# Patient Record
Sex: Female | Born: 1945 | Race: White | Hispanic: No | Marital: Married | Smoking: Former smoker
Health system: Southern US, Community
[De-identification: ages and names within clinical notes are randomized; demographics above are authoritative.]

## PROBLEM LIST (undated history)

## (undated) DIAGNOSIS — M109 Gout, unspecified: Secondary | ICD-10-CM

## (undated) DIAGNOSIS — M1990 Unspecified osteoarthritis, unspecified site: Secondary | ICD-10-CM

## (undated) DIAGNOSIS — T7840XA Allergy, unspecified, initial encounter: Secondary | ICD-10-CM

## (undated) DIAGNOSIS — K219 Gastro-esophageal reflux disease without esophagitis: Secondary | ICD-10-CM

## (undated) DIAGNOSIS — Z8619 Personal history of other infectious and parasitic diseases: Secondary | ICD-10-CM

## (undated) DIAGNOSIS — K8020 Calculus of gallbladder without cholecystitis without obstruction: Secondary | ICD-10-CM

## (undated) DIAGNOSIS — E079 Disorder of thyroid, unspecified: Secondary | ICD-10-CM

## (undated) DIAGNOSIS — K635 Polyp of colon: Secondary | ICD-10-CM

## (undated) DIAGNOSIS — I10 Essential (primary) hypertension: Secondary | ICD-10-CM

## (undated) HISTORY — DX: Personal history of other infectious and parasitic diseases: Z86.19

## (undated) HISTORY — DX: Gout, unspecified: M10.9

## (undated) HISTORY — DX: Calculus of gallbladder without cholecystitis without obstruction: K80.20

## (undated) HISTORY — DX: Disorder of thyroid, unspecified: E07.9

## (undated) HISTORY — PX: KNEE SURGERY: SHX244

## (undated) HISTORY — PX: HERNIA REPAIR: SHX51

## (undated) HISTORY — DX: Essential (primary) hypertension: I10

## (undated) HISTORY — DX: Unspecified osteoarthritis, unspecified site: M19.90

## (undated) HISTORY — DX: Gastro-esophageal reflux disease without esophagitis: K21.9

## (undated) HISTORY — DX: Allergy, unspecified, initial encounter: T78.40XA

## (undated) HISTORY — PX: WISDOM TOOTH EXTRACTION: SHX21

## (undated) HISTORY — PX: COLONOSCOPY: SHX174

## (undated) HISTORY — DX: Polyp of colon: K63.5

---

## 2001-02-10 HISTORY — PX: CHOLECYSTECTOMY: SHX55

## 2005-02-10 HISTORY — PX: ABDOMINAL HYSTERECTOMY: SHX81

## 2016-10-23 DIAGNOSIS — H524 Presbyopia: Secondary | ICD-10-CM | POA: Diagnosis not present

## 2016-10-23 DIAGNOSIS — H52223 Regular astigmatism, bilateral: Secondary | ICD-10-CM | POA: Diagnosis not present

## 2016-10-23 DIAGNOSIS — H5203 Hypermetropia, bilateral: Secondary | ICD-10-CM | POA: Diagnosis not present

## 2016-10-23 DIAGNOSIS — H259 Unspecified age-related cataract: Secondary | ICD-10-CM | POA: Diagnosis not present

## 2017-01-12 ENCOUNTER — Other Ambulatory Visit (INDEPENDENT_AMBULATORY_CARE_PROVIDER_SITE_OTHER): Payer: Medicare Other

## 2017-01-12 ENCOUNTER — Ambulatory Visit (INDEPENDENT_AMBULATORY_CARE_PROVIDER_SITE_OTHER): Payer: Medicare Other | Admitting: Family Medicine

## 2017-01-12 ENCOUNTER — Encounter: Payer: Self-pay | Admitting: Family Medicine

## 2017-01-12 VITALS — BP 134/84 | HR 61 | Temp 98.1°F | Ht 63.75 in | Wt 233.4 lb

## 2017-01-12 DIAGNOSIS — M1A9XX1 Chronic gout, unspecified, with tophus (tophi): Secondary | ICD-10-CM

## 2017-01-12 DIAGNOSIS — E039 Hypothyroidism, unspecified: Secondary | ICD-10-CM

## 2017-01-12 DIAGNOSIS — I10 Essential (primary) hypertension: Secondary | ICD-10-CM

## 2017-01-12 DIAGNOSIS — K219 Gastro-esophageal reflux disease without esophagitis: Secondary | ICD-10-CM

## 2017-01-12 DIAGNOSIS — Z13220 Encounter for screening for lipoid disorders: Secondary | ICD-10-CM

## 2017-01-12 DIAGNOSIS — M109 Gout, unspecified: Secondary | ICD-10-CM | POA: Insufficient documentation

## 2017-01-12 HISTORY — DX: Essential (primary) hypertension: I10

## 2017-01-12 HISTORY — DX: Hypothyroidism, unspecified: E03.9

## 2017-01-12 LAB — TSH: TSH: 4.41 u[IU]/mL (ref 0.35–4.50)

## 2017-01-12 LAB — COMPREHENSIVE METABOLIC PANEL
ALK PHOS: 61 U/L (ref 39–117)
ALT: 12 U/L (ref 0–35)
AST: 15 U/L (ref 0–37)
Albumin: 4.6 g/dL (ref 3.5–5.2)
BILIRUBIN TOTAL: 0.5 mg/dL (ref 0.2–1.2)
BUN: 18 mg/dL (ref 6–23)
CALCIUM: 10.2 mg/dL (ref 8.4–10.5)
CO2: 30 meq/L (ref 19–32)
Chloride: 104 mEq/L (ref 96–112)
Creatinine, Ser: 0.82 mg/dL (ref 0.40–1.20)
GFR: 73.37 mL/min (ref >60.00)
Glucose, Bld: 98 mg/dL (ref 70–99)
Potassium: 4.6 mEq/L (ref 3.5–5.1)
Sodium: 142 mEq/L (ref 135–145)
TOTAL PROTEIN: 7.1 g/dL (ref 6.0–8.3)

## 2017-01-12 LAB — CBC
HCT: 43.6 % (ref 36.0–46.0)
Hemoglobin: 14.4 g/dL (ref 12.0–15.0)
MCHC: 32.9 g/dL (ref 30.0–36.0)
MCV: 89.4 fl (ref 78.0–100.0)
PLATELETS: 264 10*3/uL (ref 150.0–400.0)
RBC: 4.88 Mil/uL (ref 3.87–5.11)
RDW: 14.7 % (ref 11.5–15.5)
WBC: 6.6 10*3/uL (ref 4.0–10.5)

## 2017-01-12 LAB — T4, FREE: Free T4: 1.02 ng/dL (ref 0.60–1.60)

## 2017-01-12 LAB — LIPID PANEL
CHOL/HDL RATIO: 3
Cholesterol: 202 mg/dL — ABNORMAL HIGH (ref 0–200)
HDL: 70.5 mg/dL (ref >39.00)
LDL Cholesterol: 108 mg/dL — ABNORMAL HIGH (ref 0–99)
NonHDL: 131.69
TRIGLYCERIDES: 118 mg/dL (ref 0.0–149.0)
VLDL: 23.6 mg/dL (ref 0.0–40.0)

## 2017-01-12 MED ORDER — LEVOTHYROXINE SODIUM 50 MCG PO TABS: 50.0000 ug | ORAL_TABLET | Freq: Every day | ORAL | 3 refills | Status: DC

## 2017-01-12 NOTE — Addendum Note (Signed)
Addended by: Harl Bowie on: 01/12/2017 12:14 PM   Modules accepted: Orders

## 2017-01-12 NOTE — Assessment & Plan Note (Signed)
Continue protonix.  Was unable to wean off of PPI.

## 2017-01-12 NOTE — Assessment & Plan Note (Signed)
Continue allopurinol 

## 2017-01-12 NOTE — Addendum Note (Signed)
Addended by: Lynnea Ferrier on: 01/12/2017 11:46 AM   Modules accepted: Orders

## 2017-01-12 NOTE — Assessment & Plan Note (Signed)
Well controlled on current rxs. No changes made. 

## 2017-01-12 NOTE — Addendum Note (Signed)
Addended by: Harl Bowie on: 01/12/2017 12:13 PM   Modules accepted: Orders

## 2017-01-12 NOTE — Addendum Note (Signed)
Addended by: Lynnea Ferrier on: 01/12/2017 11:59 AM   Modules accepted: Orders

## 2017-01-12 NOTE — Assessment & Plan Note (Signed)
Continue current dose of synthroid.  Check labs today. 

## 2017-01-12 NOTE — Patient Instructions (Signed)
Great to meet you.  We will call you with your lab results and you can view them online.

## 2017-01-12 NOTE — Progress Notes (Signed)
Subjective:   Patient ID: Virginia Parrish, female    DOB: [birth year], 71 y.o.   MRN: [id number]  Virginia Parrish is a pleasant 71 y.o. year old female who presents to clinic today with [place] (Patient is here today to establish care.  She comes to [place] from [place] in [place].  She is not currently fasting.  She had her last pap 4.2018 and states that when we receive her records we will get all of that including mammogram ect.)  on 01/12/2017  HPI:  Hypothyroidism- on synthroid 50 mcg daily and has been on this dose for over 5 years (maybe closer to 10). Denies any symptoms hypothyroidism or hyperthyroidism.  HTN- has been well controlled on current rx for years.  GERD- on protonix.  Was not able to tolerate symptoms off of it.  Gout- on allopurinol.  Has only had 3 flares but most recently- left big toe flare was the most severe.  Current Outpatient Medications on File Prior to Visit  Medication Sig Dispense Refill  . allopurinol (ZYLOPRIM) 100 MG tablet Take 100 mg by mouth daily.    . Calcium Carb-Cholecalciferol (LIQUID CALCIUM WITH D3 PO) Take 1 tablet by mouth daily.    . Cholecalciferol (VITAMIN D-3) 5000 units TABS Take 1 tablet by mouth daily.    . pantoprazole (PROTONIX) 40 MG tablet Take 40 mg by mouth daily.    Marland Kitchen spironolactone-hydrochlorothiazide (ALDACTAZIDE) 25-25 MG tablet Take 1 tablet by mouth daily.    . vitamin E 1000 UNIT capsule Take 1,000 Units by mouth daily.     No current facility-administered medications on file prior to visit.     Not on File  Past Medical History:  Diagnosis Date  . Allergy   . Colon polyp    benign  . GERD (gastroesophageal reflux disease)   . History of chicken pox   . Hypertension     Past Surgical History:  Procedure Laterality Date  . ABDOMINAL HYSTERECTOMY  2007   Complete due to cervical cancer  . CHOLECYSTECTOMY  2003    Family History  Problem Relation Age of Onset  . Arthritis Mother   .  Hearing loss Mother   . Hyperlipidemia Mother   . Hypertension Mother   . Kidney disease Mother   . Arthritis Father   . Heart disease Father   . Drug abuse Maternal Grandmother   . Arthritis Maternal Grandfather   . Drug abuse Paternal Grandfather     Social History   Socioeconomic History  . Marital status: Married    Spouse name: Not on file  . Number of children: Not on file  . Years of education: Not on file  . Highest education level: Not on file  Social Needs  . Financial resource strain: Not on file  . Food insecurity - worry: Not on file  . Food insecurity - inability: Not on file  . Transportation needs - medical: Not on file  . Transportation needs - non-medical: Not on file  Occupational History  . Not on file  Tobacco Use  . Smoking status: Never Smoker  . Smokeless tobacco: Never Used  Substance and Sexual Activity  . Alcohol use: Yes    Comment: Occas.  . Drug use: No  . Sexual activity: Yes    Partners: Male  Other Topics Concern  . Not on file  Social History Narrative  . Not on file     The PMH, PSH, Social History, Family History,  Medications, and allergies have been reviewed in [facility], and have been updated if relevant.   Review of Systems  Constitutional: Negative.   HENT: Negative.   Eyes: Negative.   Respiratory: Negative.   Cardiovascular: Negative.   Gastrointestinal: Negative.   Endocrine: Negative.   Genitourinary: Negative.   Musculoskeletal: Negative.   Allergic/Immunologic: Negative.   Neurological: Negative.   Hematological: Negative.   Psychiatric/Behavioral: Negative.   All other systems reviewed and are negative.      Objective:    BP 134/84 (BP Location: Left Arm, Patient Position: Sitting, Cuff Size: Large)   Pulse 61   Temp 98.1 F (36.7 C) (Oral)   Ht 5' 3.75" (1.619 m)   Wt 233 lb 6.4 oz (105.9 kg)   SpO2 98%   BMI 40.38 kg/m    Physical Exam   General:  Well-developed,well-nourished,in no acute  distress; alert,appropriate and cooperative throughout examination Head:  normocephalic and atraumatic.   Eyes:  vision grossly intact, PERRL Ears:  R ear normal and L ear normal externally, TMs clear bilaterally Nose:  no external deformity.   Mouth:  good dentition.   Neck:  No deformities, masses, or tenderness noted. Lungs:  Normal respiratory effort, chest expands symmetrically. Lungs are clear to auscultation, no crackles or wheezes. Heart:  Normal rate and regular rhythm. S1 and S2 normal without gallop, murmur, click, rub or other extra sounds. Abdomen:  Bowel sounds positive,abdomen soft and non-tender without masses, organomegaly or hernias noted. Msk:  No deformity or scoliosis noted of thoracic or lumbar spine.   Extremities:  No clubbing, cyanosis, edema, or deformity noted with normal full range of motion of all joints.   Neurologic:  alert & oriented X3 and gait normal.   Skin:  Intact without suspicious lesions or rashes Cervical Nodes:  No lymphadenopathy noted Axillary Nodes:  No palpable lymphadenopathy Psych:  Cognition and judgment appear intact. Alert and cooperative with normal attention span and concentration. 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

## 2017-01-14 ENCOUNTER — Telehealth: Payer: Self-pay | Admitting: Family Medicine

## 2017-01-14 NOTE — Telephone Encounter (Signed)
Copied from [place] [id number]. Topic: Quick Communication - See Telephone Encounter >> Jan 14, 2017  1:15 PM Aurelio Brash B wrote: CRM for notification. See Telephone encounter for:  Pt got refill on thyroid med,  usually gwets synthroid  this time it was levothyroxine, pt wants to make sure Dr Deborra Medina is ok with that,  pt is ok with it

## 2017-01-15 NOTE — Telephone Encounter (Signed)
Sometimes people do have different reactions to the generic versus trade name but most often, they are interchangable.  We can either just keep an eye her symptoms or call in trade name.

## 2017-01-15 NOTE — Telephone Encounter (Signed)
TA-Plz see note stating that pt received generic Levothyroxine when she usually gets DAW but she says she is ok with this/plz advise if this is ok with you as well/thx dmf

## 2017-01-16 ENCOUNTER — Encounter: Payer: Self-pay | Admitting: Family Medicine

## 2017-01-16 NOTE — Telephone Encounter (Signed)
LMOVM stating that it is ok for generic but to watch for any symptoms/thx dmf

## 2017-02-25 DIAGNOSIS — H2513 Age-related nuclear cataract, bilateral: Secondary | ICD-10-CM | POA: Diagnosis not present

## 2017-02-25 DIAGNOSIS — H25013 Cortical age-related cataract, bilateral: Secondary | ICD-10-CM | POA: Diagnosis not present

## 2017-02-25 DIAGNOSIS — H524 Presbyopia: Secondary | ICD-10-CM | POA: Diagnosis not present

## 2017-02-25 DIAGNOSIS — H0014 Chalazion left upper eyelid: Secondary | ICD-10-CM | POA: Diagnosis not present

## 2017-03-09 ENCOUNTER — Telehealth: Payer: Self-pay | Admitting: Family Medicine

## 2017-03-09 MED ORDER — PANTOPRAZOLE SODIUM 40 MG PO TBEC: 40.0000 mg | DELAYED_RELEASE_TABLET | Freq: Every day | ORAL | 3 refills | Status: DC

## 2017-03-09 NOTE — Telephone Encounter (Signed)
Copied from [place] [id number]. Topic: Quick Communication - Rx Refill/Question >> Mar 09, 2017  1:00 PM Synthia Innocent wrote: Medication:   pantoprazole (PROTONIX) 40 MG tablet, 90 day supply  Has the patient contacted their pharmacy? Yes.     (Agent: If no, request that the patient contact the pharmacy for the refill.)  Preferred Pharmacy (with phone number or street name): [facility]   Agent: Please be advised that RX refills may take up to 3 business days. We ask that you follow-up with your pharmacy.

## 2017-03-09 NOTE — Telephone Encounter (Signed)
Rx approved/thx dmf

## 2017-03-09 NOTE — Telephone Encounter (Signed)
Pt requesting refill on Protonix.  90 day supply. LOV: 01/12/17 LR: no information. Just says "historical provider" Routed back to office.

## 2017-05-15 NOTE — Progress Notes (Signed)
Subjective:   Virginia Parrish is a 72 y.o. female who presents for an Initial Medicare Annual Wellness Visit. The Patient was informed that the wellness visit is to identify future health risk and educate and initiate measures that can reduce risk for increased disease through the lifespan.   Describes health as fair, good or great? Good  Review of Systems   No ROS.  Medicare Wellness Visit. Additional risk factors are reflected in the social history. Cardiac Risk Factors include: advanced age (>61men, >46 women);hypertension Sleep patterns: Strives for 8 hrs sleep each night. Melatonin. Smoke Alarms: Feels safe in home. Smoke alarms in place.  Living environment; residence and Firearm Safety: Lives with husband  In 1 story home.  Seat Belt Safety/Bike Helmet: Wears seat belt.   Female:   Pap- utd      Mammo- ordered       Dexa scan- utd       CCS- cologuard 08/13/2105-negative ( per pt)     Objective:    Today's Vitals   05/20/17 0938  BP: (!) 150/82  Pulse: (!) 52  SpO2: 98%  Weight: 239 lb 9.6 oz (108.7 kg)  Height: 5\' 4"  (1.626 m)   Body mass index is 41.13 kg/m.  Advanced Directives 05/20/2017  Does Patient Have a Medical Advance Directive? No  Would patient like information on creating a medical advance directive? Yes (MAU/Ambulatory/Procedural Areas - Information given)    Current Medications (verified) Outpatient Encounter Medications as of 05/20/2017  Medication Sig  . Calcium Carb-Cholecalciferol (LIQUID CALCIUM WITH D3 PO) Take 1 tablet by mouth daily.  . Cholecalciferol (VITAMIN D-3) 5000 units TABS Take 1 tablet by mouth daily.  . clotrimazole-betamethasone (LOTRISONE) cream Apply 1 application topically as needed.  Marland Kitchen levothyroxine (SYNTHROID) 50 MCG tablet Take 1 tablet (50 mcg total) by mouth daily before breakfast. (DAW)  . pantoprazole (PROTONIX) 40 MG tablet Take 1 tablet (40 mg total) by mouth daily.  Marland Kitchen spironolactone-hydrochlorothiazide (ALDACTAZIDE)  25-25 MG tablet Take 1 tablet by mouth daily.  . vitamin E 1000 UNIT capsule Take 1,000 Units by mouth daily.  Marland Kitchen allopurinol (ZYLOPRIM) 100 MG tablet Take 100 mg by mouth daily.   No facility-administered encounter medications on file as of 05/20/2017.     Allergies (verified) Patient has no known allergies.   History: Past Medical History:  Diagnosis Date  . Allergy   . Colon polyp    benign  . GERD (gastroesophageal reflux disease)   . History of chicken pox   . Hypertension    Past Surgical History:  Procedure Laterality Date  . ABDOMINAL HYSTERECTOMY  2007   Complete due to cervical cancer  . CHOLECYSTECTOMY  2003   Family History  Problem Relation Age of Onset  . Arthritis Mother   . Hearing loss Mother   . Hyperlipidemia Mother   . Hypertension Mother   . Kidney disease Mother   . Arthritis Father   . Heart disease Father   . Drug abuse Maternal Grandmother   . Arthritis Maternal Grandfather   . Drug abuse Paternal Grandfather    Social History   Socioeconomic History  . Marital status: Married    Spouse name: Not on file  . Number of children: Not on file  . Years of education: Not on file  . Highest education level: Not on file  Occupational History  . Not on file  Social Needs  . Financial resource strain: Not on file  . Food insecurity:  Worry: Not on file    Inability: Not on file  . Transportation needs:    Medical: Not on file    Non-medical: Not on file  Tobacco Use  . Smoking status: Never Smoker  . Smokeless tobacco: Never Used  Substance and Sexual Activity  . Alcohol use: Yes    Comment: wine with dinner  . Drug use: No  . Sexual activity: Yes    Partners: Male  Lifestyle  . Physical activity:    Days per week: Not on file    Minutes per session: Not on file  . Stress: Not on file  Relationships  . Social connections:    Talks on phone: Not on file    Gets together: Not on file    Attends religious service: Not on file     Active member of club or organization: Not on file    Attends meetings of clubs or organizations: Not on file    Relationship status: Not on file  Other Topics Concern  . Not on file  Social History Narrative  . Not on file    Tobacco Counseling Counseling given: Not Answered   Clinical Intake: Pain : No/denies pain   Activities of Daily Living In your present state of health, do you have any difficulty performing the following activities: 05/20/2017 01/12/2017  Hearing? N N  Vision? N N  Comment Pt states she has new prescription for glasses that she needs to get filled. last eye exam in january. -  Difficulty concentrating or making decisions? N N  Walking or climbing stairs? N N  Dressing or bathing? N N  Doing errands, shopping? N N  Preparing Food and eating ? N -  Using the Toilet? N -  In the past six months, have you accidently leaked urine? N -  Do you have problems with loss of bowel control? N -  Managing your Medications? N -  Managing your Finances? N -  Housekeeping or managing your Housekeeping? N -  Some recent data might be hidden     Immunizations and Health Maintenance  There is no immunization history on file for this patient. Health Maintenance Due  Topic Date Due  . Hepatitis C Screening  November 16, 1945  . TETANUS/TDAP  08/11/1966  . COLONOSCOPY  08/10/1997  . PNA vac Low Risk Adult (1 of 2 - PCV13) 08/10/2012    Patient Care Team: Lucille Passy, MD as PCP - General (Family Medicine)  Indicate any recent Medical Services you may have received from other than [facility] providers in the past year (date may be approximate).     Assessment:   This is a routine wellness examination for Virginia Parrish. Physical assessment deferred to PCP.  Hearing/Vision screen  Visual Acuity Screening   Right eye Left eye Both eyes  Without correction: 20/70 20/70 20/70   With correction:     Comments: Pt states she has a new prescription for glasses,but hasn't picked them  up yet.  Hearing Screening Comments: Able to hear conversational tones w/o difficulty. No issues reported.    Dietary issues and exercise activities discussed: Current Exercise Habits: Home exercise routine, Type of exercise: strength training/weights;treadmill, Time (Minutes): 60, Frequency (Times/Week): 2, Weekly Exercise (Minutes/Week): 120, Intensity: Mild   Diet (meal preparation, eat out, water intake, caffeinated beverages, dairy products, fruits and vegetables): in general, a "healthy" diet  , well balanced    Goals    . DIET - INCREASE WATER INTAKE    . Weight (  lb) < 200 lb (90.7 kg)      Depression Screen PHQ 2/9 Scores 05/20/2017 01/12/2017  PHQ - 2 Score 0 0    Fall Risk Fall Risk  05/20/2017 01/12/2017  Falls in the past year? No No    Cognitive Function: MMSE - Mini Mental State Exam 05/20/2017  Orientation to time 5  Orientation to Place 5  Registration 3  Attention/ Calculation 5  Recall 3  Language- name 2 objects 2  Language- repeat 1  Language- follow 3 step command 3  Language- read & follow direction 1  Write a sentence 1  Copy design 1  Total score 30        Screening Tests Health Maintenance  Topic Date Due  . Hepatitis C Screening  20-Dec-1945  . TETANUS/TDAP  08/11/1966  . COLONOSCOPY  08/10/1997  . PNA vac Low Risk Adult (1 of 2 - PCV13) 08/10/2012  . INFLUENZA VACCINE  09/10/2017  . MAMMOGRAM  11/01/2017  . DEXA SCAN  Completed      Plan:   Follow up with PCP as directed  Continue to eat heart healthy diet (full of fruits, vegetables, whole grains, lean protein, water--limit salt, fat, and sugar intake) and increase physical activity as tolerated.  Continue doing brain stimulating activities (puzzles, reading, adult coloring books, staying active) to keep memory sharp.   Bring a copy of your living will and/or healthcare power of attorney to your next office visit.  Consider Melatonin for sleep.  Please contact us with  information pertaining to your Pneumonia vaccines.   I have personally reviewed and noted the following in the patient's chart:   . Medical and social history . Use of alcohol, tobacco or illicit drugs  . Current medications and supplements . Functional ability and status . Nutritional status . Physical activity . Advanced directives . List of other physicians . Hospitalizations, surgeries, and ER visits in previous 12 months . Vitals . Screenings to include cognitive, depression, and falls . Referrals and appointments  In addition, I have reviewed and discussed with patient certain preventive protocols, quality metrics, and best practice recommendations. A written personalized care plan for preventive services as well as general preventive health recommendations were provided to patient.     Shela Nevin, [place]   05/20/2017

## 2017-05-20 ENCOUNTER — Ambulatory Visit (INDEPENDENT_AMBULATORY_CARE_PROVIDER_SITE_OTHER): Payer: Medicare Other | Admitting: Behavioral Health

## 2017-05-20 ENCOUNTER — Encounter: Payer: Self-pay | Admitting: Behavioral Health

## 2017-05-20 VITALS — BP 150/82 | HR 52 | Ht 64.0 in | Wt 239.6 lb

## 2017-05-20 DIAGNOSIS — Z0000 Encounter for general adult medical examination without abnormal findings: Secondary | ICD-10-CM | POA: Diagnosis not present

## 2017-05-20 DIAGNOSIS — Z1239 Encounter for other screening for malignant neoplasm of breast: Secondary | ICD-10-CM

## 2017-05-20 DIAGNOSIS — Z1231 Encounter for screening mammogram for malignant neoplasm of breast: Secondary | ICD-10-CM

## 2017-05-20 NOTE — Patient Instructions (Signed)
Continue to eat heart healthy diet (full of fruits, vegetables, whole grains, lean protein, water--limit salt, fat, and sugar intake) and increase physical activity as tolerated.  Continue doing brain stimulating activities (puzzles, reading, adult coloring books, staying active) to keep memory sharp.   Bring a copy of your living will and/or healthcare power of attorney to your next office visit.  Consider Melatonin for sleep.   Virginia Parrish , Thank you for taking time to come for your Medicare Wellness Visit. I appreciate your ongoing commitment to your health goals. Please review the following plan we discussed and let me know if I can assist you in the future.   These are the goals we discussed: Goals    . DIET - INCREASE WATER INTAKE    . Weight (lb) < 200 lb (90.7 kg)       This is a list of the screening recommended for you and due dates:  Health Maintenance  Topic Date Due  .  Hepatitis C: One time screening is recommended by Center for Disease Control  (CDC) for  adults born from [birth year] through 1963.   July 28, 1945  . Tetanus Vaccine  08/11/1966  . Colon Cancer Screening  08/10/1997  . Pneumonia vaccines (1 of 2 - PCV13) 08/10/2012  . Flu Shot  09/10/2017  . Mammogram  11/01/2017  . DEXA scan (bone density measurement)  Completed    Health Maintenance for Postmenopausal Women Menopause is a normal process in which your reproductive ability comes to an end. This process happens gradually over a span of months to years, usually between the ages of 61 and 85. Menopause is complete when you have missed 12 consecutive menstrual periods. It is important to talk with your health care provider about some of the most common conditions that affect postmenopausal women, such as heart disease, cancer, and bone loss (osteoporosis). Adopting a healthy lifestyle and getting preventive care can help to promote your health and wellness. Those actions can also lower your chances of developing some  of these common conditions. What should I know about menopause? During menopause, you may experience a number of symptoms, such as:  Moderate-to-severe hot flashes.  Night sweats.  Decrease in sex drive.  Mood swings.  Headaches.  Tiredness.  Irritability.  Memory problems.  Insomnia.  Choosing to treat or not to treat menopausal changes is an individual decision that you make with your health care provider. What should I know about hormone replacement therapy and supplements? Hormone therapy products are effective for treating symptoms that are associated with menopause, such as hot flashes and night sweats. Hormone replacement carries certain risks, especially as you become older. If you are thinking about using estrogen or estrogen with progestin treatments, discuss the benefits and risks with your health care provider. What should I know about heart disease and stroke? Heart disease, heart attack, and stroke become more likely as you age. This may be due, in part, to the hormonal changes that your body experiences during menopause. These can affect how your body processes dietary fats, triglycerides, and cholesterol. Heart attack and stroke are both medical emergencies. There are many things that you can do to help prevent heart disease and stroke:  Have your blood pressure checked at least every 1-2 years. High blood pressure causes heart disease and increases the risk of stroke.  If you are 79-68 years old, ask your health care provider if you should take aspirin to prevent a heart attack or a stroke.  Do  not use any tobacco products, including cigarettes, chewing tobacco, or electronic cigarettes. If you need help quitting, ask your health care provider.  It is important to eat a healthy diet and maintain a healthy weight. ? Be sure to include plenty of vegetables, fruits, low-fat dairy products, and lean protein. ? Avoid eating foods that are high in solid fats, added  sugars, or salt (sodium).  Get regular exercise. This is one of the most important things that you can do for your health. ? Try to exercise for at least 150 minutes each week. The type of exercise that you do should increase your heart rate and make you sweat. This is known as moderate-intensity exercise. ? Try to do strengthening exercises at least twice each week. Do these in addition to the moderate-intensity exercise.  Know your numbers.Ask your health care provider to check your cholesterol and your blood glucose. Continue to have your blood tested as directed by your health care provider.  What should I know about cancer screening? There are several types of cancer. Take the following steps to reduce your risk and to catch any cancer development as early as possible. Breast Cancer  Practice breast self-awareness. ? This means understanding how your breasts normally appear and feel. ? It also means doing regular breast self-exams. Let your health care provider know about any changes, no matter how small.  If you are 32 or older, have a clinician do a breast exam (clinical breast exam or CBE) every year. Depending on your age, family history, and medical history, it may be recommended that you also have a yearly breast X-ray (mammogram).  If you have a family history of breast cancer, talk with your health care provider about genetic screening.  If you are at high risk for breast cancer, talk with your health care provider about having an MRI and a mammogram every year.  Breast cancer (BRCA) gene test is recommended for women who have family members with BRCA-related cancers. Results of the assessment will determine the need for genetic counseling and BRCA1 and for BRCA2 testing. BRCA-related cancers include these types: ? Breast. This occurs in males or females. ? Ovarian. ? Tubal. This may also be called fallopian tube cancer. ? Cancer of the abdominal or pelvic lining (peritoneal  cancer). ? Prostate. ? Pancreatic.  Cervical, Uterine, and Ovarian Cancer Your health care provider may recommend that you be screened regularly for cancer of the pelvic organs. These include your ovaries, uterus, and vagina. This screening involves a pelvic exam, which includes checking for microscopic changes to the surface of your cervix (Pap test).  For women ages 21-65, health care providers may recommend a pelvic exam and a Pap test every three years. For women ages 37-65, they may recommend the Pap test and pelvic exam, combined with testing for human papilloma virus (HPV), every five years. Some types of HPV increase your risk of cervical cancer. Testing for HPV may also be done on women of any age who have unclear Pap test results.  Other health care providers may not recommend any screening for nonpregnant women who are considered low risk for pelvic cancer and have no symptoms. Ask your health care provider if a screening pelvic exam is right for you.  If you have had past treatment for cervical cancer or a condition that could lead to cancer, you need Pap tests and screening for cancer for at least 20 years after your treatment. If Pap tests have been discontinued for  you, your risk factors (such as having a new sexual partner) need to be reassessed to determine if you should start having screenings again. Some women have medical problems that increase the chance of getting cervical cancer. In these cases, your health care provider may recommend that you have screening and Pap tests more often.  If you have a family history of uterine cancer or ovarian cancer, talk with your health care provider about genetic screening.  If you have vaginal bleeding after reaching menopause, tell your health care provider.  There are currently no reliable tests available to screen for ovarian cancer.  Lung Cancer Lung cancer screening is recommended for adults 19-39 years old who are at high risk for  lung cancer because of a history of smoking. A yearly low-dose CT scan of the lungs is recommended if you:  Currently smoke.  Have a history of at least 30 pack-years of smoking and you currently smoke or have quit within the past 15 years. A pack-year is smoking an average of one pack of cigarettes per day for one year.  Yearly screening should:  Continue until it has been 15 years since you quit.  Stop if you develop a health problem that would prevent you from having lung cancer treatment.  Colorectal Cancer  This type of cancer can be detected and can often be prevented.  Routine colorectal cancer screening usually begins at age 41 and continues through age 17.  If you have risk factors for colon cancer, your health care provider may recommend that you be screened at an earlier age.  If you have a family history of colorectal cancer, talk with your health care provider about genetic screening.  Your health care provider may also recommend using home test kits to check for hidden blood in your stool.  A small camera at the end of a tube can be used to examine your colon directly (sigmoidoscopy or colonoscopy). This is done to check for the earliest forms of colorectal cancer.  Direct examination of the colon should be repeated every 5-10 years until age 5. However, if early forms of precancerous polyps or small growths are found or if you have a family history or genetic risk for colorectal cancer, you may need to be screened more often.  Skin Cancer  Check your skin from head to toe regularly.  Monitor any moles. Be sure to tell your health care provider: ? About any new moles or changes in moles, especially if there is a change in a mole's shape or color. ? If you have a mole that is larger than the size of a pencil eraser.  If any of your family members has a history of skin cancer, especially at a young age, talk with your health care provider about genetic  screening.  Always use sunscreen. Apply sunscreen liberally and repeatedly throughout the day.  Whenever you are outside, protect yourself by wearing long sleeves, pants, a wide-brimmed hat, and sunglasses.  What should I know about osteoporosis? Osteoporosis is a condition in which bone destruction happens more quickly than new bone creation. After menopause, you may be at an increased risk for osteoporosis. To help prevent osteoporosis or the bone fractures that can happen because of osteoporosis, the following is recommended:  If you are 74-40 years old, get at least 1,000 mg of calcium and at least 600 mg of vitamin D per day.  If you are older than age 81 but younger than age 1, get at  least 1,200 mg of calcium and at least 600 mg of vitamin D per day.  If you are older than age 22, get at least 1,200 mg of calcium and at least 800 mg of vitamin D per day.  Smoking and excessive alcohol intake increase the risk of osteoporosis. Eat foods that are rich in calcium and vitamin D, and do weight-bearing exercises several times each week as directed by your health care provider. What should I know about how menopause affects my mental health? Depression may occur at any age, but it is more common as you become older. Common symptoms of depression include:  Low or sad mood.  Changes in sleep patterns.  Changes in appetite or eating patterns.  Feeling an overall lack of motivation or enjoyment of activities that you previously enjoyed.  Frequent crying spells.  Talk with your health care provider if you think that you are experiencing depression. What should I know about immunizations? It is important that you get and maintain your immunizations. These include:  Tetanus, diphtheria, and pertussis (Tdap) booster vaccine.  Influenza every year before the flu season begins.  Pneumonia vaccine.  Shingles vaccine.  Your health care provider may also recommend other  immunizations. This information is not intended to replace advice given to you by your health care provider. Make sure you discuss any questions you have with your health care provider. Document Released: 03/21/2005 Document Revised: 08/17/2015 Document Reviewed: 10/31/2014 Elsevier Interactive Patient Education  2018 [employer].

## 2017-05-20 NOTE — Progress Notes (Signed)
I reviewed health advisor's note, was available for consultation, and agree with documentation and plan.  

## 2017-05-27 ENCOUNTER — Ambulatory Visit (HOSPITAL_BASED_OUTPATIENT_CLINIC_OR_DEPARTMENT_OTHER)
Admission: RE | Admit: 2017-05-27 | Discharge: 2017-05-27 | Disposition: A | Discharge status: Home / Self Care | Payer: Medicare Other | Source: Ambulatory Visit | Attending: Family Medicine | Admitting: Family Medicine

## 2017-05-27 DIAGNOSIS — Z1239 Encounter for other screening for malignant neoplasm of breast: Secondary | ICD-10-CM

## 2017-05-27 DIAGNOSIS — Z1231 Encounter for screening mammogram for malignant neoplasm of breast: Secondary | ICD-10-CM | POA: Insufficient documentation

## 2017-06-09 ENCOUNTER — Other Ambulatory Visit: Payer: Self-pay

## 2017-06-09 ENCOUNTER — Encounter: Payer: Self-pay | Admitting: Family Medicine

## 2017-06-09 DIAGNOSIS — E039 Hypothyroidism, unspecified: Secondary | ICD-10-CM

## 2017-06-09 NOTE — Progress Notes (Signed)
Per TA TSH, FT4, T3 for lab visit/orders placed/thx dmf

## 2017-06-18 ENCOUNTER — Other Ambulatory Visit (INDEPENDENT_AMBULATORY_CARE_PROVIDER_SITE_OTHER): Payer: Medicare Other

## 2017-06-18 DIAGNOSIS — E039 Hypothyroidism, unspecified: Secondary | ICD-10-CM

## 2017-06-18 LAB — TSH: TSH: 3.61 u[IU]/mL (ref 0.35–4.50)

## 2017-06-18 LAB — T4, FREE: Free T4: 0.95 ng/dL (ref 0.60–1.60)

## 2017-06-19 DIAGNOSIS — H25013 Cortical age-related cataract, bilateral: Secondary | ICD-10-CM | POA: Diagnosis not present

## 2017-06-19 DIAGNOSIS — H43813 Vitreous degeneration, bilateral: Secondary | ICD-10-CM | POA: Diagnosis not present

## 2017-06-19 DIAGNOSIS — H2513 Age-related nuclear cataract, bilateral: Secondary | ICD-10-CM | POA: Diagnosis not present

## 2017-06-19 LAB — T3: T3, Total: 123 ng/dL (ref 76–181)

## 2017-07-09 DIAGNOSIS — H25812 Combined forms of age-related cataract, left eye: Secondary | ICD-10-CM | POA: Diagnosis not present

## 2017-07-09 DIAGNOSIS — H2512 Age-related nuclear cataract, left eye: Secondary | ICD-10-CM | POA: Diagnosis not present

## 2017-07-09 DIAGNOSIS — H25012 Cortical age-related cataract, left eye: Secondary | ICD-10-CM | POA: Diagnosis not present

## 2017-07-23 DIAGNOSIS — H25811 Combined forms of age-related cataract, right eye: Secondary | ICD-10-CM | POA: Diagnosis not present

## 2017-07-23 DIAGNOSIS — H25011 Cortical age-related cataract, right eye: Secondary | ICD-10-CM | POA: Diagnosis not present

## 2017-07-23 DIAGNOSIS — H2511 Age-related nuclear cataract, right eye: Secondary | ICD-10-CM | POA: Diagnosis not present

## 2017-08-04 ENCOUNTER — Encounter: Payer: Self-pay | Admitting: Family Medicine

## 2017-08-04 NOTE — Telephone Encounter (Signed)
Yes okay to refill.  She has been on these rxs for years.

## 2017-08-04 NOTE — Telephone Encounter (Signed)
Dr. Deborra Medina  Please advise, we never send in Aldactazide 1 tab daily for the before and she is taking it for her BP from [place]. okey to send this in?   She will pick up rx from [facility] for synthroid.

## 2017-08-05 ENCOUNTER — Other Ambulatory Visit: Payer: Self-pay

## 2017-08-05 MED ORDER — SPIRONOLACTONE-HCTZ 25-25 MG PO TABS: 1.0000 | ORAL_TABLET | Freq: Every day | ORAL | 2 refills | Status: DC

## 2017-08-05 MED ORDER — LEVOTHYROXINE SODIUM 50 MCG PO TABS: 50.0000 ug | ORAL_TABLET | Freq: Every day | ORAL | 2 refills | Status: DC

## 2017-08-06 ENCOUNTER — Other Ambulatory Visit: Payer: Self-pay

## 2017-08-06 ENCOUNTER — Telehealth: Payer: Self-pay | Admitting: Family Medicine

## 2017-08-06 DIAGNOSIS — E039 Hypothyroidism, unspecified: Secondary | ICD-10-CM

## 2017-08-06 DIAGNOSIS — Z0000 Encounter for general adult medical examination without abnormal findings: Secondary | ICD-10-CM

## 2017-08-06 DIAGNOSIS — I10 Essential (primary) hypertension: Secondary | ICD-10-CM

## 2017-08-06 DIAGNOSIS — K219 Gastro-esophageal reflux disease without esophagitis: Secondary | ICD-10-CM

## 2017-08-06 NOTE — Telephone Encounter (Signed)
Copied from [place]. Topic: Quick Communication - See Telephone Encounter >> Aug 06, 2017  4:20 PM Burchel, Abbi R wrote:  See Telephone encounter for: 08/06/17.  [facility] [phone] Ref #: [id number]   (SYNTHROID) 50 MCG tablet is not covered by ins.Please re-send rx for levothyroxine  w/o brand name listed, if it is appropriate for the pt.

## 2017-08-08 NOTE — Telephone Encounter (Signed)
Generic Synthroid is not covered by insurance, will need the brand name Levothyroxine prescription sent, if appropriate for patient.  Last OV:01/12/17 KJI:ZXYO Pharmacy: [place], [place] [phone] (Phone) [phone] (Fax)

## 2017-08-10 MED ORDER — SYNTHROID 50 MCG PO TABS: 50.0000 ug | ORAL_TABLET | Freq: Every day | ORAL | 2 refills | Status: DC

## 2017-08-10 NOTE — Telephone Encounter (Signed)
Sent in per TA/thx dmf

## 2017-08-10 NOTE — Telephone Encounter (Signed)
Yes okay to send as requested. 

## 2017-08-10 NOTE — Telephone Encounter (Signed)
TA-May I send in Levothyroxine DAW as ins won't cover Generic Synthroid/plz advise/thx dmf

## 2017-08-17 ENCOUNTER — Telehealth: Payer: Self-pay | Admitting: Family Medicine

## 2017-08-17 NOTE — Telephone Encounter (Signed)
Copied from [place] [id number]. Topic: Quick Communication - Rx Refill/Question >> Aug 17, 2017  3:00 PM Gardiner Ramus wrote: Medication:SYNTHROID 50 MCG tablet [[id number]] [facility] and stated that insurance will not pay for brand name and would like L-thyroxine to be called in please advise refrence # [id number]  Preferred Pharmacy (with phone number or street name):[facility] [place], [place] - [address] [phone] (Phone) [phone] (Fax)  Agent: Please be advised that RX refills may take up to 3 business days. We ask that you follow-up with your pharmacy.

## 2017-08-18 NOTE — Telephone Encounter (Signed)
Medication:SYNTHROID 50 MCG tablet [[id number]]  'Maggie' from [facility],states that insurance will not pay for brand name and would like L-thyroxine to be called in. Please advise.  Reference  # [id number]  [place], [place] - [address]        [phone] (Phone) [phone] (Fax)

## 2017-08-18 NOTE — Telephone Encounter (Signed)
Insurance checking status. Call back [phone] REF # [id number]

## 2017-08-19 ENCOUNTER — Other Ambulatory Visit: Payer: Self-pay

## 2017-08-19 MED ORDER — LEVOTHYROXINE SODIUM 50 MCG PO TABS: 50.0000 ug | ORAL_TABLET | Freq: Every day | ORAL | 3 refills | Status: DC

## 2017-08-19 NOTE — Telephone Encounter (Signed)
Sent in generic per pt req/thx dmf

## 2017-10-16 DIAGNOSIS — Z23 Encounter for immunization: Secondary | ICD-10-CM | POA: Diagnosis not present

## 2017-11-20 ENCOUNTER — Ambulatory Visit (INDEPENDENT_AMBULATORY_CARE_PROVIDER_SITE_OTHER): Payer: Medicare Other

## 2017-11-20 ENCOUNTER — Encounter: Payer: Self-pay | Admitting: Nurse Practitioner

## 2017-11-20 ENCOUNTER — Ambulatory Visit (INDEPENDENT_AMBULATORY_CARE_PROVIDER_SITE_OTHER): Payer: Medicare Other | Admitting: Nurse Practitioner

## 2017-11-20 VITALS — BP 126/86 | HR 56 | Temp 98.2°F | Ht 64.0 in | Wt 231.0 lb

## 2017-11-20 DIAGNOSIS — J0100 Acute maxillary sinusitis, unspecified: Secondary | ICD-10-CM

## 2017-11-20 DIAGNOSIS — J209 Acute bronchitis, unspecified: Secondary | ICD-10-CM

## 2017-11-20 DIAGNOSIS — R05 Cough: Secondary | ICD-10-CM | POA: Diagnosis not present

## 2017-11-20 MED ORDER — FLUTICASONE PROPIONATE 50 MCG/ACT NA SUSP: 2.0000 | Freq: Every day | NASAL | 0 refills | Status: DC

## 2017-11-20 MED ORDER — ALBUTEROL SULFATE HFA 108 (90 BASE) MCG/ACT IN AERS: 1.0000 | INHALATION_SPRAY | Freq: Four times a day (QID) | RESPIRATORY_TRACT | 0 refills | Status: DC | PRN

## 2017-11-20 MED ORDER — GUAIFENESIN-DM 100-10 MG/5ML PO SYRP: 5.0000 mL | ORAL_SOLUTION | ORAL | 0 refills | Status: DC | PRN

## 2017-11-20 MED ORDER — AZITHROMYCIN 250 MG PO TABS: 250.0000 mg | ORAL_TABLET | Freq: Every day | ORAL | 0 refills | Status: DC

## 2017-11-20 MED ORDER — HYDROCODONE-HOMATROPINE 5-1.5 MG/5ML PO SYRP: 5.0000 mL | ORAL_SOLUTION | Freq: Every evening | ORAL | 0 refills | Status: DC | PRN

## 2017-11-20 MED ORDER — OXYMETAZOLINE HCL 0.05 % NA SOLN: 1.0000 | Freq: Two times a day (BID) | NASAL | 0 refills | Status: DC

## 2017-11-20 NOTE — Progress Notes (Signed)
Subjective:  Patient ID: Virginia Parrish, female    DOB: 09-05-1945  Age: 72 y.o. MRN: [id number]  CC: Cough (patient just got back from the cruise/coughing yellow mucus,cold,cant sleep---had amox--had bad diarrhea--stop this med. going on for 4 wks. took alot of otc. ) and Eye Drainage (left eye drainage or red. notice this morning. )   URI   This is a new problem. The current episode started 1 to 4 weeks ago. The problem has been unchanged. Associated symptoms include congestion, coughing, headaches, rhinorrhea, sinus pain and a sore throat. Pertinent negatives include no abdominal pain, chest pain, diarrhea, dysuria, ear pain, joint pain, joint swelling, nausea, neck pain, plugged ear sensation, rash or wheezing. She has tried antihistamine, decongestant and increased fluids for the symptoms. The treatment provided mild relief.  Onset of symptoms 2days into cruise trip, amoxicillin prescribed by cruise provider, had to stopped oral abx after 3days due to AND pain and diarrhea. GI symptoms improved after improved with discontinuation of oral abx. Persistent cough and sinus congestion. Cough is worse at night.  Reviewed past Medical, Social and Family history today.  Outpatient Medications Prior to Visit  Medication Sig Dispense Refill  . allopurinol (ZYLOPRIM) 100 MG tablet Take 100 mg by mouth daily.    . Calcium Carb-Cholecalciferol (LIQUID CALCIUM WITH D3 PO) Take 1 tablet by mouth daily.    . Cholecalciferol (VITAMIN D-3) 5000 units TABS Take 1 tablet by mouth daily.    . clotrimazole-betamethasone (LOTRISONE) cream Apply 1 application topically as needed.    Marland Kitchen levothyroxine (SYNTHROID, LEVOTHROID) 50 MCG tablet Take 1 tablet (50 mcg total) by mouth daily. 90 tablet 3  . pantoprazole (PROTONIX) 40 MG tablet Take 1 tablet (40 mg total) by mouth daily. 90 tablet 3  . spironolactone-hydrochlorothiazide (ALDACTAZIDE) 25-25 MG tablet Take 1 tablet by mouth daily. 90 tablet 2  . vitamin E 1000  UNIT capsule Take 1,000 Units by mouth daily.     No facility-administered medications prior to visit.     ROS See HPI  Objective:  BP 126/86   Pulse (!) 56   Temp 98.2 F (36.8 C) (Oral)   Ht 5\' 4"  (1.626 m)   Wt 231 lb (104.8 kg)   SpO2 98%   BMI 39.65 kg/m   BP Readings from Last 3 Encounters:  11/20/17 126/86  05/20/17 (!) 150/82  01/12/17 134/84    Wt Readings from Last 3 Encounters:  11/20/17 231 lb (104.8 kg)  05/20/17 239 lb 9.6 oz (108.7 kg)  01/12/17 233 lb 6.4 oz (105.9 kg)    Physical Exam  Constitutional: She is oriented to person, place, and time. She appears well-developed and well-nourished.  HENT:  Right Ear: External ear normal.  Left Ear: External ear normal.  Nose: Mucosal edema and rhinorrhea present. Right sinus exhibits maxillary sinus tenderness. Left sinus exhibits maxillary sinus tenderness.  Mouth/Throat: Uvula is midline. Posterior oropharyngeal erythema present. No oropharyngeal exudate.  Eyes: Pupils are equal, round, and reactive to light. EOM are normal. Right eye exhibits no chemosis, no discharge, no exudate and no hordeolum. No foreign body present in the right eye. Left eye exhibits chemosis and discharge. Left eye exhibits no exudate and no hordeolum. No foreign body present in the left eye. Right conjunctiva is not injected. Right conjunctiva has no hemorrhage. Left conjunctiva is injected. Left conjunctiva has no hemorrhage. No scleral icterus.  Neck: Normal range of motion. Neck supple.  Cardiovascular: Normal rate, regular rhythm and normal heart sounds.  Pulmonary/Chest: Effort normal and breath sounds normal. No respiratory distress.  Neurological: She is alert and oriented to person, place, and time.  Skin: No rash noted.  Psychiatric: She has a normal mood and affect. Her behavior is normal.  Vitals reviewed.   Lab Results  Component Value Date   WBC 6.6 01/12/2017   HGB 14.4 01/12/2017   HCT 43.6 01/12/2017   PLT 264.0  01/12/2017   GLUCOSE 98 01/12/2017   CHOL 202 (H) 01/12/2017   TRIG 118.0 01/12/2017   HDL 70.50 01/12/2017   LDLCALC 108 (H) 01/12/2017   ALT 12 01/12/2017   AST 15 01/12/2017   NA 142 01/12/2017   K 4.6 01/12/2017   CL 104 01/12/2017   CREATININE 0.82 01/12/2017   BUN 18 01/12/2017   CO2 30 01/12/2017   TSH 3.61 06/18/2017    Mm 3d Screen Breast Bilateral  Result Date: 05/27/2017 CLINICAL DATA:  Screening. EXAM: DIGITAL SCREENING BILATERAL MAMMOGRAM WITH TOMO AND CAD COMPARISON:  None. ACR Breast Density Category b: There are scattered areas of fibroglandular density. FINDINGS: There are no findings suspicious for malignancy. Images were processed with CAD. IMPRESSION: No mammographic evidence of malignancy. A result letter of this screening mammogram will be mailed directly to the patient. RECOMMENDATION: Screening mammogram in one year. (Code:SM-B-01Y) BI-RADS CATEGORY  1: Negative. Electronically Signed   By: Fidela Salisbury M.D.   On: 05/27/2017 17:53    Assessment & Plan:   Virginia Parrish was seen today for cough and eye drainage.  Diagnoses and all orders for this visit:  Acute bronchitis, unspecified organism -     DG Chest 2 View -     guaiFENesin-dextromethorphan (ROBITUSSIN DM) 100-10 MG/5ML syrup; Take 5 mLs by mouth every 4 (four) hours as needed for cough. -     HYDROcodone-homatropine (HYCODAN) 5-1.5 MG/5ML syrup; Take 5 mLs by mouth at bedtime as needed for cough. -     albuterol (PROVENTIL HFA;VENTOLIN HFA) 108 (90 Base) MCG/ACT inhaler; Inhale 1-2 puffs into the lungs every 6 (six) hours as needed. -     azithromycin (ZITHROMAX Z-PAK) 250 MG tablet; Take 1 tablet (250 mg total) by mouth daily. Take 2tabs on first day, then 1tab once a day till complete  Acute non-recurrent maxillary sinusitis -     fluticasone (FLONASE) 50 MCG/ACT nasal spray; Place 2 sprays into both nostrils daily. -     oxymetazoline (AFRIN NASAL SPRAY) 0.05 % nasal spray; Place 1 spray into  both nostrils 2 (two) times daily. Use only for 3days, then stop -     azithromycin (ZITHROMAX Z-PAK) 250 MG tablet; Take 1 tablet (250 mg total) by mouth daily. Take 2tabs on first day, then 1tab once a day till complete   I am having Virginia Parrish "Virginia Parrish" start on guaiFENesin-dextromethorphan, HYDROcodone-homatropine, fluticasone, oxymetazoline, albuterol, and azithromycin. I am also having her maintain her allopurinol, vitamin E, Vitamin D-3, Calcium Carb-Cholecalciferol (LIQUID CALCIUM WITH D3 PO), pantoprazole, clotrimazole-betamethasone, spironolactone-hydrochlorothiazide, and levothyroxine.  Meds ordered this encounter  Medications  . guaiFENesin-dextromethorphan (ROBITUSSIN DM) 100-10 MG/5ML syrup    Sig: Take 5 mLs by mouth every 4 (four) hours as needed for cough.    Dispense:  118 mL    Refill:  0    Order Specific Question:   Supervising Provider    Answer:   MATTHEWS, CODY [[id number]]  . HYDROcodone-homatropine (HYCODAN) 5-1.5 MG/5ML syrup    Sig: Take 5 mLs by mouth at bedtime as needed for cough.  Dispense:  60 mL    Refill:  0    Order Specific Question:   Supervising Provider    Answer:   MATTHEWS, CODY [[id number]]  . fluticasone (FLONASE) 50 MCG/ACT nasal spray    Sig: Place 2 sprays into both nostrils daily.    Dispense:  16 g    Refill:  0    Order Specific Question:   Supervising Provider    Answer:   MATTHEWS, CODY [[id number]]  . oxymetazoline (AFRIN NASAL SPRAY) 0.05 % nasal spray    Sig: Place 1 spray into both nostrils 2 (two) times daily. Use only for 3days, then stop    Dispense:  30 mL    Refill:  0    Order Specific Question:   Supervising Provider    Answer:   MATTHEWS, CODY [[id number]]  . albuterol (PROVENTIL HFA;VENTOLIN HFA) 108 (90 Base) MCG/ACT inhaler    Sig: Inhale 1-2 puffs into the lungs every 6 (six) hours as needed.    Dispense:  1 Inhaler    Refill:  0    Order Specific Question:   Supervising Provider    Answer:   MATTHEWS, CODY [[id number]]  . azithromycin  (ZITHROMAX Z-PAK) 250 MG tablet    Sig: Take 1 tablet (250 mg total) by mouth daily. Take 2tabs on first day, then 1tab once a day till complete    Dispense:  6 tablet    Refill:  0    Order Specific Question:   Supervising Provider    Answer:   MATTHEWS, CODY [[id number]]    Follow-up: Return if symptoms worsen or fail to improve.  Wilfred Lacy, NP

## 2017-11-20 NOTE — Patient Instructions (Addendum)
URI Instructions: Flonase and Afrin use: apply 1spray of afrin in each nare, wait 32mins, then apply 2sprays of flonase in each nare. Use both nasal spray consecutively x 3days, then flonase only for at least 14days.  Encourage adequate oral hydration.  Normal CXR Azithromycin sent for sinusitis. Also take probiotic OTC 1cap BID to minimize diarrhea.  Viral Conjunctivitis, Adult Viral conjunctivitis is an inflammation of the clear membrane that covers the white part of your eye and the inner surface of your eyelid (conjunctiva). The inflammation is caused by a viral infection. The blood vessels in the conjunctiva become inflamed, causing the eye to become red or pink, and often itchy. Viral conjunctivitis can be easily passed from one person to another (is contagious). This condition is often called pink eye. What are the causes? This condition is caused by a virus. A virus is a type of contagious germ. It can be spread by touching objects that have been contaminated with the virus, such as doorknobs or towels. It can also be passed through droplets, such as from coughing or sneezing. What are the signs or symptoms? Symptoms of this condition include:  Eye redness.  Tearing or watery eyes.  Itchy and irritated eyes.  Burning feeling in the eyes.  Clear drainage from the eye.  Swollen eyelids.  A gritty feeling in the eye.  Light sensitivity.  This condition often occurs with other symptoms, such as a fever, nausea, or a rash. How is this diagnosed? This condition is diagnosed with a medical history and physical exam. If you have discharge from your eye, the discharge may be tested to rule out other causes of conjunctivitis. How is this treated? Viral conjunctivitis does not respond to medicines that kill bacteria (antibiotics). Treatment for viral conjunctivitis is directed at stopping a bacterial infection from developing in addition to the viral infection. Treatment also aims to  relieve your symptoms, such as itching. This may be done with antihistamine drops or other eye medicines. Rarely, steroid eye drops or antiviral medicines may be prescribed. Follow these instructions at home: Medicines   Take or apply over-the-counter and prescription medicines only as told by your health care provider.  Be very careful to avoid touching the edge of the eyelid with the eye drop bottle or ointment tube when applying medicines to the affected eye. Being careful this way will stop you from spreading the infection to the other eye or to other people. Eye care  Avoid touching or rubbing your eyes.  Apply a warm, wet, clean washcloth to your eye for 10-20 minutes, 3-4 times per day or as told by your health care provider.  If you wear contact lenses, do not wear them until the inflammation is gone and your health care provider says it is safe to wear them again. Ask your health care provider how to sterilize or replace your contact lenses before using them again. Wear glasses until you can resume wearing contacts.  Avoid wearing eye makeup until the inflammation is gone. Throw away any old eye cosmetics that may be contaminated.  Gently wipe away any drainage from your eye with a warm, wet washcloth or a cotton ball. General instructions  Change or wash your pillowcase every day or as told by your health care provider.  Do not share towels, pillowcases, washcloths, eye makeup, makeup brushes, contact lenses, or glasses. This may spread the infection.  Wash your hands often with soap and water. Use paper towels to dry your hands. If soap  and water are not available, use hand sanitizer.  Try to avoid contact with other people for one week or as told by your health care provider. Contact a health care provider if:  Your symptoms do not improve with treatment or they get worse.  You have increased pain.  Your vision becomes blurry.  You have a fever.  You have facial  pain, redness, or swelling.  You have yellow or green drainage coming from your eye.  You have new symptoms. This information is not intended to replace advice given to you by your health care provider. Make sure you discuss any questions you have with your health care provider. Document Released: 04/19/2002 Document Revised: 08/25/2015 Document Reviewed: 08/14/2015 Elsevier Interactive Patient Education  [employer].

## 2018-01-20 ENCOUNTER — Ambulatory Visit: Payer: Medicare Other | Admitting: *Deleted

## 2018-01-20 ENCOUNTER — Ambulatory Visit: Payer: Medicare Other | Admitting: Family Medicine

## 2018-01-26 ENCOUNTER — Ambulatory Visit: Payer: Medicare Other | Admitting: Family Medicine

## 2018-02-17 ENCOUNTER — Ambulatory Visit: Payer: Medicare Other | Admitting: Family Medicine

## 2018-03-03 ENCOUNTER — Ambulatory Visit: Payer: Medicare Other | Admitting: *Deleted

## 2018-03-03 ENCOUNTER — Other Ambulatory Visit: Payer: Self-pay

## 2018-03-03 ENCOUNTER — Encounter: Payer: Self-pay | Admitting: Family Medicine

## 2018-03-03 ENCOUNTER — Ambulatory Visit (INDEPENDENT_AMBULATORY_CARE_PROVIDER_SITE_OTHER): Payer: Medicare Other | Admitting: Family Medicine

## 2018-03-03 VITALS — BP 124/86 | HR 57 | Temp 98.7°F | Ht 63.75 in | Wt 230.6 lb

## 2018-03-03 DIAGNOSIS — Z636 Dependent relative needing care at home: Secondary | ICD-10-CM

## 2018-03-03 DIAGNOSIS — E039 Hypothyroidism, unspecified: Secondary | ICD-10-CM | POA: Diagnosis not present

## 2018-03-03 DIAGNOSIS — M1A9XX1 Chronic gout, unspecified, with tophus (tophi): Secondary | ICD-10-CM

## 2018-03-03 DIAGNOSIS — I10 Essential (primary) hypertension: Secondary | ICD-10-CM

## 2018-03-03 DIAGNOSIS — Z23 Encounter for immunization: Secondary | ICD-10-CM | POA: Diagnosis not present

## 2018-03-03 DIAGNOSIS — K219 Gastro-esophageal reflux disease without esophagitis: Secondary | ICD-10-CM

## 2018-03-03 DIAGNOSIS — R002 Palpitations: Secondary | ICD-10-CM

## 2018-03-03 LAB — COMPREHENSIVE METABOLIC PANEL
ALT: 10 U/L (ref 0–35)
AST: 16 U/L (ref 0–37)
Albumin: 4.3 g/dL (ref 3.5–5.2)
Alkaline Phosphatase: 59 U/L (ref 39–117)
BUN: 19 mg/dL (ref 6–23)
CO2: 31 mEq/L (ref 19–32)
Calcium: 9.9 mg/dL (ref 8.4–10.5)
Chloride: 104 mEq/L (ref 96–112)
Creatinine, Ser: 0.82 mg/dL (ref 0.40–1.20)
GFR: 68.81 mL/min (ref >60.00)
Glucose, Bld: 97 mg/dL (ref 70–99)
Potassium: 4.4 mEq/L (ref 3.5–5.1)
Sodium: 141 mEq/L (ref 135–145)
Total Bilirubin: 0.5 mg/dL (ref 0.2–1.2)
Total Protein: 6.8 g/dL (ref 6.0–8.3)

## 2018-03-03 LAB — CBC WITH DIFFERENTIAL/PLATELET
Basophils Absolute: 0 10*3/uL (ref 0.0–0.1)
Basophils Relative: 0.6 % (ref 0.0–3.0)
EOS ABS: 0.2 10*3/uL (ref 0.0–0.7)
Eosinophils Relative: 2.8 % (ref 0.0–5.0)
HCT: 44.5 % (ref 36.0–46.0)
Hemoglobin: 14.6 g/dL (ref 12.0–15.0)
Lymphocytes Relative: 29.9 % (ref 12.0–46.0)
Lymphs Abs: 1.7 10*3/uL (ref 0.7–4.0)
MCHC: 32.9 g/dL (ref 30.0–36.0)
MCV: 88.4 fl (ref 78.0–100.0)
Monocytes Absolute: 0.5 10*3/uL (ref 0.1–1.0)
Monocytes Relative: 8.4 % (ref 3.0–12.0)
Neutro Abs: 3.3 10*3/uL (ref 1.4–7.7)
Neutrophils Relative %: 58.3 % (ref 43.0–77.0)
Platelets: 250 10*3/uL (ref 150.0–400.0)
RBC: 5.03 Mil/uL (ref 3.87–5.11)
RDW: 14.7 % (ref 11.5–15.5)
WBC: 5.7 10*3/uL (ref 4.0–10.5)

## 2018-03-03 LAB — T4, FREE: Free T4: 0.9 ng/dL (ref 0.60–1.60)

## 2018-03-03 LAB — LIPID PANEL
Cholesterol: 188 mg/dL (ref 0–200)
HDL: 68.6 mg/dL (ref >39.00)
LDL Cholesterol: 105 mg/dL — ABNORMAL HIGH (ref 0–99)
NonHDL: 119.34
Total CHOL/HDL Ratio: 3
Triglycerides: 70 mg/dL (ref 0.0–149.0)
VLDL: 14 mg/dL (ref 0.0–40.0)

## 2018-03-03 LAB — TSH: TSH: 2.4 u[IU]/mL (ref 0.35–4.50)

## 2018-03-03 NOTE — Assessment & Plan Note (Signed)
Well controlled on current dose of synthroid. No changes made. Check labs today.

## 2018-03-03 NOTE — Assessment & Plan Note (Signed)
Well controlled on current rxs. No changes made today. Due for labs.

## 2018-03-03 NOTE — Progress Notes (Signed)
Subjective:   Patient ID: Virginia Parrish, female    DOB: 1945-03-10, 73 y.o.   MRN: [id number]  Henreitta Spittler is a pleasant 73 y.o. year old female who presents to clinic today with Follow-up (Patient is here today for a F/U with thyroid.  She is not currently fasting.  She agrees to Hep-C lab draw. She agrees to get the PNV-13 vaccine.  There are future orders in the system for CBC/CMP/Lipid/TSH/FT4/T3.  )  on 03/03/2018  HPI:  Has been under a lot of stress of late- 18 year old mother but she feels she is coping with this okay other than palpitations.  Intermittent palpitations- her iphone said ? afib twice but she is not sure is eh can trust that.  She could definitely feel her heart was racing when she experienced these episodes- HR in 150s. Has not had an episode in over a month.  Under less stress now that her mother is in SNF and that she is going to [place] for a month next month.  Hypothyroidism- on synthroid 50 mcg daily and has been on this dose for over 6 years (maybe closer to 10). Denies any symptoms hypothyroidism or hyperthyroidism.  Lab Results  Component Value Date   TSH 3.61 06/18/2017   T3TOTAL 123 06/18/2017    HTN- has been well controlled on current rx for years.Taking Aldactazide 25-25 mg daily. Denies HA, blurred vision, CP, SOB or LE edema.  Lab Results  Component Value Date   CREATININE 0.82 01/12/2017   BP Readings from Last 3 Encounters:  03/03/18 124/86  11/20/17 126/86  05/20/17 (!) 150/82     GERD- on protonix.  Was not able to tolerate symptoms off of it.  Gout- does not take allopurinol regularly.  Has not had a gout flare in over a year.    Current Outpatient Medications on File Prior to Visit  Medication Sig Dispense Refill  . albuterol (PROVENTIL HFA;VENTOLIN HFA) 108 (90 Base) MCG/ACT inhaler Inhale 1-2 puffs into the lungs every 6 (six) hours as needed. 1 Inhaler 0  . allopurinol (ZYLOPRIM) 100 MG tablet Take 100 mg by mouth  daily.    . Calcium Carb-Cholecalciferol (LIQUID CALCIUM WITH D3 PO) Take 1 tablet by mouth daily.    . Cholecalciferol (VITAMIN D-3) 5000 units TABS Take 1 tablet by mouth daily.    . clotrimazole-betamethasone (LOTRISONE) cream Apply 1 application topically as needed.    . fluticasone (FLONASE) 50 MCG/ACT nasal spray Place 2 sprays into both nostrils daily. 16 g 0  . levothyroxine (SYNTHROID, LEVOTHROID) 50 MCG tablet Take 1 tablet (50 mcg total) by mouth daily. 90 tablet 3  . oxymetazoline (AFRIN NASAL SPRAY) 0.05 % nasal spray Place 1 spray into both nostrils 2 (two) times daily. Use only for 3days, then stop 30 mL 0  . pantoprazole (PROTONIX) 40 MG tablet Take 1 tablet (40 mg total) by mouth daily. 90 tablet 3  . spironolactone-hydrochlorothiazide (ALDACTAZIDE) 25-25 MG tablet Take 1 tablet by mouth daily. 90 tablet 2  . vitamin E 1000 UNIT capsule Take 1,000 Units by mouth daily.     No current facility-administered medications on file prior to visit.     No Known Allergies  Past Medical History:  Diagnosis Date  . Allergy   . Colon polyp    benign  . GERD (gastroesophageal reflux disease)   . History of chicken pox   . Hypertension     Past Surgical History:  Procedure Laterality Date  .  ABDOMINAL HYSTERECTOMY  2007   Complete due to cervical cancer  . CHOLECYSTECTOMY  2003    Family History  Problem Relation Age of Onset  . Arthritis Mother   . Hearing loss Mother   . Hyperlipidemia Mother   . Hypertension Mother   . Kidney disease Mother   . Arthritis Father   . Heart disease Father   . Drug abuse Maternal Grandmother   . Arthritis Maternal Grandfather   . Drug abuse Paternal Grandfather     Social History   Socioeconomic History  . Marital status: Married    Spouse name: Not on file  . Number of children: Not on file  . Years of education: Not on file  . Highest education level: Not on file  Occupational History  . Not on file  Social Needs  .  Financial resource strain: Not on file  . Food insecurity:    Worry: Not on file    Inability: Not on file  . Transportation needs:    Medical: Not on file    Non-medical: Not on file  Tobacco Use  . Smoking status: Never Smoker  . Smokeless tobacco: Never Used  Substance and Sexual Activity  . Alcohol use: Yes    Comment: wine with dinner  . Drug use: No  . Sexual activity: Yes    Partners: Male  Lifestyle  . Physical activity:    Days per week: Not on file    Minutes per session: Not on file  . Stress: Not on file  Relationships  . Social connections:    Talks on phone: Not on file    Gets together: Not on file    Attends religious service: Not on file    Active member of club or organization: Not on file    Attends meetings of clubs or organizations: Not on file    Relationship status: Not on file  . Intimate partner violence:    Fear of current or ex partner: Not on file    Emotionally abused: Not on file    Physically abused: Not on file    Forced sexual activity: Not on file  Other Topics Concern  . Not on file  Social History Narrative  . Not on file   The PMH, PSH, Social History, Family History, Medications, and allergies have been reviewed in [facility], and have been updated if relevant.     Review of Systems  Constitutional: Negative.   HENT: Negative.   Eyes: Negative.   Respiratory: Negative.   Cardiovascular: Positive for palpitations. Negative for chest pain and leg swelling.  Gastrointestinal: Negative.   Endocrine: Negative.   Genitourinary: Negative.   Musculoskeletal: Negative.   Skin: Negative.   Allergic/Immunologic: Negative.   Neurological: Negative.   Hematological: Negative.   Psychiatric/Behavioral: Negative.   All other systems reviewed and are negative.      Objective:    BP 124/86 (BP Location: Left Arm, Patient Position: Sitting, Cuff Size: Normal)   Pulse (!) 57   Temp 98.7 F (37.1 C) (Oral)   Ht 5' 3.75" (1.619 m)   Wt  230 lb 9.6 oz (104.6 kg)   SpO2 99%   BMI 39.89 kg/m    Physical Exam    General:  Well-developed,well-nourished,in no acute distress; alert,appropriate and cooperative throughout examination Head:  normocephalic and atraumatic.   Eyes:  vision grossly intact, PERRL Ears:  R ear normal and L ear normal externally, TMs clear bilaterally Nose:  no external deformity.  Mouth:  good dentition.   Neck:  No deformities, masses, or tenderness noted.   Lungs:  Normal respiratory effort, chest expands symmetrically. Lungs are clear to auscultation, no crackles or wheezes. Heart:  Normal rate and regular rhythm. S1 and S2 normal without gallop, murmur, click, rub or other extra sounds. Abdomen:  Bowel sounds positive,abdomen soft and non-tender without masses, organomegaly or hernias noted. Msk:  No deformity or scoliosis noted of thoracic or lumbar spine.   Extremities:  No clubbing, cyanosis, edema, or deformity noted with normal full range of motion of all joints.   Neurologic:  alert & oriented X3 and gait normal.   Skin:  Intact without suspicious lesions or rashes Cervical Nodes:  No lymphadenopathy noted Axillary Nodes:  No palpable lymphadenopathy Psych:  Cognition and judgment appear intact. Alert and cooperative with normal attention span and concentration. 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

## 2018-03-03 NOTE — Assessment & Plan Note (Signed)
Well controlled. No changes made today. 

## 2018-03-03 NOTE — Patient Instructions (Addendum)
Please schedule with Wellness Coach for AWV :) Great to see you. I will call you with your lab results from today and you can view them online.   Please start taking 81 mg aspirin daily.  As soon as you return from [place], please let me know.  If you develop the palpitations while you're in [place], let me know immediately and go to an urgent care.

## 2018-03-03 NOTE — Assessment & Plan Note (Signed)
No flares this year.

## 2018-03-03 NOTE — Assessment & Plan Note (Addendum)
>  40 minutes spent in face to face time with patient, >50% spent in counselling or coordination of care discussing palpitations, care given stress, HTN, hypothyroidism. I am concerned about paraxysmal a fib although I am not sure how much I trust an apple watch reading.  Was not seen by anyone for an EKG. Likely stress induced.  Will also check labs today.  I advised seeing a cardiologist for holter monitor as PAF can lead to stroke as well and she would need anticoagulation.  She is deferring cards referral at this time as she is leaving for cardiology for a month.  She does agree to go to UC in [place] if the palpitations return and to start taking ASA 81 mg daily. EKG done today, probably normal variant.

## 2018-03-03 NOTE — Addendum Note (Signed)
Addended by: Marrion Coy on: 03/03/2018 11:23 AM   Modules accepted: Orders

## 2018-03-04 LAB — T3: T3, Total: 120 ng/dL (ref 76–181)

## 2018-04-21 ENCOUNTER — Telehealth: Payer: Self-pay | Admitting: Family Medicine

## 2018-04-21 MED ORDER — AMOXICILLIN 500 MG PO CAPS: ORAL_CAPSULE | ORAL | 0 refills | Status: DC

## 2018-04-21 NOTE — Telephone Encounter (Signed)
Copied from [place] [id number]. Topic: Quick Communication - See Telephone Encounter >> Apr 21, 2018  9:12 AM Sheran Luz wrote: CRM for notification. See Telephone encounter for: 04/21/18.   Patient was advised by her dentist that she would need a "pre-med" before procedure tomorrow. Dentist would not prescribe anything as they have not seen pt yet and advised her to contact PCP. She states she typically takes 4 tablets of 500mg  amoxicillin. Patient's appointment is tomorrow morning so she is requesting this be sent as soon as possible. Please advise.

## 2018-04-21 NOTE — Telephone Encounter (Signed)
Per TA sent in #4/pt aware/thx dmf

## 2018-05-12 HISTORY — PX: EYE SURGERY: SHX253

## 2018-05-20 ENCOUNTER — Encounter: Payer: Self-pay | Admitting: Nurse Practitioner

## 2018-05-20 ENCOUNTER — Other Ambulatory Visit: Payer: Self-pay

## 2018-05-20 MED ORDER — LEVOTHYROXINE SODIUM 50 MCG PO TABS: 50.0000 ug | ORAL_TABLET | Freq: Every day | ORAL | 3 refills | Status: DC

## 2018-05-26 ENCOUNTER — Encounter: Payer: PRIVATE HEALTH INSURANCE | Admitting: Family Medicine

## 2018-05-26 ENCOUNTER — Ambulatory Visit: Payer: PRIVATE HEALTH INSURANCE | Admitting: *Deleted

## 2018-06-07 ENCOUNTER — Encounter: Payer: Self-pay | Admitting: Family Medicine

## 2018-06-07 MED ORDER — PANTOPRAZOLE SODIUM 40 MG PO TBEC: 40.0000 mg | DELAYED_RELEASE_TABLET | Freq: Every day | ORAL | 1 refills | Status: DC

## 2018-06-19 ENCOUNTER — Encounter: Payer: Self-pay | Admitting: Family Medicine

## 2018-06-21 MED ORDER — SPIRONOLACTONE-HCTZ 25-25 MG PO TABS: 1.0000 | ORAL_TABLET | Freq: Every day | ORAL | 2 refills | Status: DC

## 2018-08-10 ENCOUNTER — Telehealth: Payer: Self-pay

## 2018-08-10 NOTE — Progress Notes (Signed)
Subjective:   Onalee Steinbach is a 73 y.o. female who presents for Medicare Annual (Subsequent) preventive examination.  Review of Systems: No ROS.  Medicare Wellness Virtual Visit.  Visual/audio telehealth visit, UTA vital signs.   See social history for additional risk factors. Cardiac Risk Factors include: advanced age (>64men, >20 women);hypertension Sleep patterns:   No issues Home Safety/Smoke Alarms: Feels safe in home. Smoke alarms in place.  Lives with husband in 1 story home.  Female:   Mammo-  ordered  Dexa scan-  ordered      CCS- pt reports last 4 yrs ago     Objective:     Vitals: BP 132/84 (BP Location: Left Arm, Patient Position: Sitting, Cuff Size: Normal) Comment: all vitals done by M.Frederick CMA  Pulse 63   Ht 5\' 4"  (1.626 m)   Wt 235 lb (106.6 kg)   SpO2 99%   BMI 40.34 kg/m   Body mass index is 40.34 kg/m.  Advanced Directives 08/11/2018 05/20/2017  Does Patient Have a Medical Advance Directive? Yes No  Does patient want to make changes to medical advance directive? Yes (MAU/Ambulatory/Procedural Areas - Information given) -  Would patient like information on creating a medical advance directive? - Yes (MAU/Ambulatory/Procedural Areas - Information given)    Tobacco Social History   Tobacco Use  Smoking Status Never Smoker  Smokeless Tobacco Never Used     Counseling given: Not Answered   Clinical Intake: Pain : No/denies pain   Past Medical History:  Diagnosis Date  . Allergy   . Colon polyp    benign  . GERD (gastroesophageal reflux disease)   . History of chicken pox   . Hypertension    Past Surgical History:  Procedure Laterality Date  . ABDOMINAL HYSTERECTOMY  2007   Complete due to cervical cancer  . CHOLECYSTECTOMY  2003  . EYE SURGERY Bilateral 05/12/2018   cataract sx   Family History  Problem Relation Age of Onset  . Arthritis Mother   . Hearing loss Mother   . Hyperlipidemia Mother   . Hypertension Mother   .  Kidney disease Mother   . Arthritis Father   . Heart disease Father   . Drug abuse Maternal Grandmother   . Arthritis Maternal Grandfather   . Drug abuse Paternal Grandfather    Social History   Socioeconomic History  . Marital status: Married    Spouse name: Not on file  . Number of children: Not on file  . Years of education: Not on file  . Highest education level: Not on file  Occupational History  . Not on file  Social Needs  . Financial resource strain: Not on file  . Food insecurity    Worry: Not on file    Inability: Not on file  . Transportation needs    Medical: Not on file    Non-medical: Not on file  Tobacco Use  . Smoking status: Never Smoker  . Smokeless tobacco: Never Used  Substance and Sexual Activity  . Alcohol use: Yes    Comment: wine with dinner  . Drug use: No  . Sexual activity: Yes    Partners: Male  Lifestyle  . Physical activity    Days per week: Not on file    Minutes per session: Not on file  . Stress: Not on file  Relationships  . Social Herbalist on phone: Not on file    Gets together: Not on file  Attends religious service: Not on file    Active member of club or organization: Not on file    Attends meetings of clubs or organizations: Not on file    Relationship status: Not on file  Other Topics Concern  . Not on file  Social History Narrative  . Not on file    Outpatient Encounter Medications as of 08/11/2018  Medication Sig  . allopurinol (ZYLOPRIM) 100 MG tablet Take 100 mg by mouth daily.  Marland Kitchen amoxicillin (AMOXIL) 500 MG capsule Take 4 prior to dental procedure  . aspirin (ASPIR-LOW) 81 MG EC tablet   . Calcium Carb-Cholecalciferol (LIQUID CALCIUM WITH D3 PO) Take 1 tablet by mouth daily.  . Cholecalciferol (VITAMIN D-3) 5000 units TABS Take 1 tablet by mouth daily.  . clotrimazole-betamethasone (LOTRISONE) cream Apply 1 application topically as needed.  Marland Kitchen levothyroxine (SYNTHROID, LEVOTHROID) 50 MCG tablet Take 1  tablet (50 mcg total) by mouth daily.  . pantoprazole (PROTONIX) 40 MG tablet Take 1 tablet (40 mg total) by mouth daily.  Marland Kitchen spironolactone-hydrochlorothiazide (ALDACTAZIDE) 25-25 MG tablet Take 1 tablet by mouth daily.  . vitamin E 1000 UNIT capsule Take 1,000 Units by mouth daily.  . [DISCONTINUED] albuterol (PROVENTIL HFA;VENTOLIN HFA) 108 (90 Base) MCG/ACT inhaler Inhale 1-2 puffs into the lungs every 6 (six) hours as needed.  . [DISCONTINUED] fluticasone (FLONASE) 50 MCG/ACT nasal spray Place 2 sprays into both nostrils daily.  . [DISCONTINUED] oxymetazoline (AFRIN NASAL SPRAY) 0.05 % nasal spray Place 1 spray into both nostrils 2 (two) times daily. Use only for 3days, then stop   No facility-administered encounter medications on file as of 08/11/2018.     Activities of Daily Living In your present state of health, do you have any difficulty performing the following activities: 08/11/2018 03/03/2018  Hearing? N N  Vision? N N  Difficulty concentrating or making decisions? N N  Walking or climbing stairs? N N  Dressing or bathing? N N  Doing errands, shopping? N N  Preparing Food and eating ? N -  Using the Toilet? N -  In the past six months, have you accidently leaked urine? N -  Do you have problems with loss of bowel control? N -  Managing your Medications? N -  Managing your Finances? N -  Housekeeping or managing your Housekeeping? N -  Some recent data might be hidden    Patient Care Team: Lucille Passy, MD as PCP - General (Family Medicine)    Assessment:   This is a routine wellness examination for Jaria. Physical assessment deferred to PCP.  Exercise Activities and Dietary recommendations Current Exercise Habits: Home exercise routine, Type of exercise: walking, Time (Minutes): 45, Frequency (Times/Week): 3, Weekly Exercise (Minutes/Week): 135, Intensity: Mild, Exercise limited by: None identified Diet (meal preparation, eat out, water intake, caffeinated beverages,  dairy products, fruits and vegetables): well balanced, on average, 3 meals per day     Goals    . DIET - INCREASE WATER INTAKE    . Weight (lb) < 200 lb (90.7 kg)       Fall Risk Fall Risk  08/11/2018 03/03/2018 05/20/2017 01/12/2017  Falls in the past year? 0 0 No No  Follow up - Falls evaluation completed - -    Depression Screen PHQ 2/9 Scores 08/11/2018 03/03/2018 05/20/2017 01/12/2017  PHQ - 2 Score 0 0 0 0     Cognitive Function Ad8 score reviewed for issues:  Issues making decisions:no  Less interest in hobbies / activities:no  Repeats questions, stories (family complaining):no  Trouble using ordinary gadgets (microwave, computer, phone):no  Forgets the month or year: no  Mismanaging finances: no  Remembering appts:no  Daily problems with thinking and/or memory:no Ad8 score is=0   MMSE - Mini Mental State Exam 05/20/2017  Orientation to time 5  Orientation to Place 5  Registration 3  Attention/ Calculation 5  Recall 3  Language- name 2 objects 2  Language- repeat 1  Language- follow 3 step command 3  Language- read & follow direction 1  Write a sentence 1  Copy design 1  Total score 30        Immunization History  Administered Date(s) Administered  . Influenza, High Dose Seasonal PF 10/28/2016, 10/16/2017  . Pneumococcal Conjugate-13 03/03/2018    Screening Tests Health Maintenance  Topic Date Due  . TETANUS/TDAP  03/04/2019 (Originally 08/11/1964)  . Hepatitis C Screening  08/11/2019 (Originally 27-Apr-1945)  . INFLUENZA VACCINE  09/11/2018  . PNA vac Low Risk Adult (2 of 2 - PPSV23) 03/04/2019  . MAMMOGRAM  05/28/2019  . Fecal DNA (Cologuard)  08/13/2020  . DEXA SCAN  Completed      Plan:    Please schedule your next medicare wellness visit with me in 1 yr.  Continue to eat heart healthy diet (full of fruits, vegetables, whole grains, lean protein, water--limit salt, fat, and sugar intake) and increase physical activity as tolerated.  Continue  doing brain stimulating activities (puzzles, reading, adult coloring books, staying active) to keep memory sharp.   Bring a copy of your living will and/or healthcare power of attorney to your next office visit.   I have personally reviewed and noted the following in the patient's chart:   . Medical and social history . Use of alcohol, tobacco or illicit drugs  . Current medications and supplements . Functional ability and status . Nutritional status . Physical activity . Advanced directives . List of other physicians . Hospitalizations, surgeries, and ER visits in previous 12 months . Vitals . Screenings to include cognitive, depression, and falls . Referrals and appointments  In addition, I have reviewed and discussed with patient certain preventive protocols, quality metrics, and best practice recommendations. A written personalized care plan for preventive services as well as general preventive health recommendations were provided to patient.     Shela Nevin, [place]  08/11/2018

## 2018-08-10 NOTE — Progress Notes (Signed)
Subjective:   Patient ID: Virginia Parrish, female    DOB: [birth year], 73 y.o.   MRN: [id number]  Virginia Parrish is a pleasant 73 y.o. year old female who presents to clinic today with Follow-up (Pt screened at vehicle.  She is scheduled to see the Health Coach for her AWV at [place]. She is due for her 3D Mammogram at [facility] in July. On 1.22.20 the following labs were drawn: CMP/CBC/Lipid/TSH/FT4,T3. She will call to see if ins covers Hep-C.)  on 08/11/2018  HPI:    Hypothyroidism- on synthroid 50 mcg daily and has been on this dose for over 6 years (maybe closer to 10). Denies any symptoms hypothyroidism or hyperthyroidism.  Lab Results  Component Value Date   TSH 2.40 03/03/2018     HTN- has been well controlled on current rx for years.Taking Aldactazide 25-25 mg daily. Denies HA, blurred vision, CP, SOB or LE edema.  BP Readings from Last 3 Encounters:  08/11/18 132/84  03/03/18 124/86  11/20/17 126/86    Lab Results  Component Value Date   CHOL 188 03/03/2018   HDL 68.60 03/03/2018   LDLCALC 105 (H) 03/03/2018   TRIG 70.0 03/03/2018   CHOLHDL 3 03/03/2018     Lab Results  Component Value Date   CREATININE 0.82 03/03/2018    GERD- on protonix. Was not able to tolerate symptoms off of it.  Gout- does not take allopurinol regularly.  Has not had a gout flare in over a year.  Current Outpatient Medications on File Prior to Visit  Medication Sig Dispense Refill  . allopurinol (ZYLOPRIM) 100 MG tablet Take 100 mg by mouth daily.    Marland Kitchen amoxicillin (AMOXIL) 500 MG capsule Take 4 prior to dental procedure 4 capsule 0  . aspirin (ASPIR-LOW) 81 MG EC tablet     . Calcium Carb-Cholecalciferol (LIQUID CALCIUM WITH D3 PO) Take 1 tablet by mouth daily.    . Cholecalciferol (VITAMIN D-3) 5000 units TABS Take 1 tablet by mouth daily.    . clotrimazole-betamethasone (LOTRISONE) cream Apply 1 application topically as needed.    Marland Kitchen levothyroxine (SYNTHROID, LEVOTHROID) 50  MCG tablet Take 1 tablet (50 mcg total) by mouth daily. 90 tablet 3  . pantoprazole (PROTONIX) 40 MG tablet Take 1 tablet (40 mg total) by mouth daily. 90 tablet 1  . spironolactone-hydrochlorothiazide (ALDACTAZIDE) 25-25 MG tablet Take 1 tablet by mouth daily. 90 tablet 2  . vitamin E 1000 UNIT capsule Take 1,000 Units by mouth daily.     No current facility-administered medications on file prior to visit.     No Known Allergies  Past Medical History:  Diagnosis Date  . Allergy   . Colon polyp    benign  . GERD (gastroesophageal reflux disease)   . History of chicken pox   . Hypertension     Past Surgical History:  Procedure Laterality Date  . ABDOMINAL HYSTERECTOMY  2007   Complete due to cervical cancer  . CHOLECYSTECTOMY  2003    Family History  Problem Relation Age of Onset  . Arthritis Mother   . Hearing loss Mother   . Hyperlipidemia Mother   . Hypertension Mother   . Kidney disease Mother   . Arthritis Father   . Heart disease Father   . Drug abuse Maternal Grandmother   . Arthritis Maternal Grandfather   . Drug abuse Paternal Grandfather     Social History   Socioeconomic History  . Marital status: Married    Spouse name:  Not on file  . Number of children: Not on file  . Years of education: Not on file  . Highest education level: Not on file  Occupational History  . Not on file  Social Needs  . Financial resource strain: Not on file  . Food insecurity    Worry: Not on file    Inability: Not on file  . Transportation needs    Medical: Not on file    Non-medical: Not on file  Tobacco Use  . Smoking status: Never Smoker  . Smokeless tobacco: Never Used  Substance and Sexual Activity  . Alcohol use: Yes    Comment: wine with dinner  . Drug use: No  . Sexual activity: Yes    Partners: Male  Lifestyle  . Physical activity    Days per week: Not on file    Minutes per session: Not on file  . Stress: Not on file  Relationships  . Social  Herbalist on phone: Not on file    Gets together: Not on file    Attends religious service: Not on file    Active member of club or organization: Not on file    Attends meetings of clubs or organizations: Not on file    Relationship status: Not on file  . Intimate partner violence    Fear of current or ex partner: Not on file    Emotionally abused: Not on file    Physically abused: Not on file    Forced sexual activity: Not on file  Other Topics Concern  . Not on file  Social History Narrative  . Not on file   The PMH, PSH, Social History, Family History, Medications, and allergies have been reviewed in [facility], and have been updated if relevant.   Review of Systems  Constitutional: Negative.   HENT: Negative.   Eyes: Negative.   Respiratory: Negative.   Cardiovascular: Negative.   Gastrointestinal: Negative.   Endocrine: Negative.   Genitourinary: Negative.   Musculoskeletal: Negative.   Allergic/Immunologic: Negative.   Neurological: Negative.   Hematological: Negative for adenopathy.  Psychiatric/Behavioral: Negative.   All other systems reviewed and are negative.      Objective:    BP 132/84 (BP Location: Left Arm, Patient Position: Sitting, Cuff Size: Normal)   Pulse 63   Temp 97.6 F (36.4 C) (Oral)   Ht 5\' 4"  (1.626 m)   Wt 235 lb 9.6 oz (106.9 kg)   SpO2 99%   BMI 40.44 kg/m   Wt Readings from Last 3 Encounters:  08/11/18 235 lb 9.6 oz (106.9 kg)  03/03/18 230 lb 9.6 oz (104.6 kg)  11/20/17 231 lb (104.8 kg)    Physical Exam   General:  Well-developed,well-nourished,in no acute distress; alert,appropriate and cooperative throughout examination Head:  normocephalic and atraumatic.   Eyes:  vision grossly intact, PERRL Ears:  R ear normal and L ear normal externally, TMs clear bilaterally Nose:  no external deformity.   Mouth:  good dentition.   Neck:  No deformities, masses, or tenderness noted. Breasts:  No mass, nodules, thickening,  tenderness, bulging, retraction, inflamation, nipple discharge or skin changes noted.   Lungs:  Normal respiratory effort, chest expands symmetrically. Lungs are clear to auscultation, no crackles or wheezes. Heart:  Normal rate and regular rhythm. S1 and S2 normal without gallop, murmur, click, rub or other extra sounds. Abdomen:  Bowel sounds positive,abdomen soft and non-tender without masses, organomegaly or hernias noted. Msk:  No deformity  or scoliosis noted of thoracic or lumbar spine.   Extremities:  No clubbing, cyanosis, edema, or deformity noted with normal full range of motion of all joints.   Neurologic:  alert & oriented X3 and gait normal.   Skin:  Intact without suspicious lesions or rashes Cervical Nodes:  No lymphadenopathy noted Axillary Nodes:  No palpable lymphadenopathy Psych:  Cognition and judgment appear intact. Alert and cooperative with normal attention span and concentration. 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

## 2018-08-10 NOTE — Telephone Encounter (Signed)
Questions for Screening COVID-19  Symptom onset: none  Travel or Contacts: none  During this illness, did/does the patient experience any of the following symptoms? Fever >100.72F []   Yes [x]   No []   Unknown Subjective fever (felt feverish) []   Yes [x]   No []   Unknown Chills []   Yes [x]   No []   Unknown Muscle aches (myalgia) []   Yes [x]   No []   Unknown Runny nose (rhinorrhea) []   Yes [x]   No []   Unknown Sore throat []   Yes [x]   No []   Unknown Cough (new onset or worsening of chronic cough) []   Yes [x]   No []   Unknown Shortness of breath (dyspnea) []   Yes [x]   No []   Unknown Nausea or vomiting []   Yes [x]   No []   Unknown Headache []   Yes [x]   No []   Unknown Abdominal pain  []   Yes [x]   No []   Unknown Diarrhea (?3 loose/looser than normal stools/24hr period) []   Yes [x]   No []   Unknown Other, specify:  Patient risk factors: Smoker? []   Current []   Former [x]   Never If female, currently pregnant? []   Yes [x]   No  Patient Active Problem List   Diagnosis Date Noted  . Caregiver stress 03/03/2018  . Palpitations 03/03/2018  . Hypothyroidism 01/12/2017  . GERD (gastroesophageal reflux disease) 01/12/2017  . HTN (hypertension) 01/12/2017  . Gout 01/12/2017    Plan:  []   High risk for COVID-19 with red flags go to ED (with CP, SOB, weak/lightheaded, or fever > 101.5). Call ahead.  []   High risk for COVID-19 but stable. Inform provider and coordinate time for [facility] visit.   [x]   No red flags but URI signs or symptoms okay for [facility] visit.

## 2018-08-11 ENCOUNTER — Encounter: Payer: Self-pay | Admitting: Family Medicine

## 2018-08-11 ENCOUNTER — Encounter: Payer: Self-pay | Admitting: *Deleted

## 2018-08-11 ENCOUNTER — Ambulatory Visit (INDEPENDENT_AMBULATORY_CARE_PROVIDER_SITE_OTHER): Payer: Medicare Other | Admitting: Family Medicine

## 2018-08-11 ENCOUNTER — Ambulatory Visit (INDEPENDENT_AMBULATORY_CARE_PROVIDER_SITE_OTHER): Payer: Medicare Other | Admitting: *Deleted

## 2018-08-11 VITALS — BP 132/84 | HR 63 | Ht 64.0 in | Wt 235.0 lb

## 2018-08-11 VITALS — BP 132/84 | HR 63 | Temp 97.6°F | Ht 64.0 in | Wt 235.6 lb

## 2018-08-11 DIAGNOSIS — Z1239 Encounter for other screening for malignant neoplasm of breast: Secondary | ICD-10-CM | POA: Diagnosis not present

## 2018-08-11 DIAGNOSIS — E039 Hypothyroidism, unspecified: Secondary | ICD-10-CM

## 2018-08-11 DIAGNOSIS — Z780 Asymptomatic menopausal state: Secondary | ICD-10-CM

## 2018-08-11 DIAGNOSIS — Z0000 Encounter for general adult medical examination without abnormal findings: Secondary | ICD-10-CM

## 2018-08-11 DIAGNOSIS — M1A9XX1 Chronic gout, unspecified, with tophus (tophi): Secondary | ICD-10-CM | POA: Diagnosis not present

## 2018-08-11 DIAGNOSIS — I10 Essential (primary) hypertension: Secondary | ICD-10-CM | POA: Diagnosis not present

## 2018-08-11 DIAGNOSIS — Z1159 Encounter for screening for other viral diseases: Secondary | ICD-10-CM

## 2018-08-11 DIAGNOSIS — K219 Gastro-esophageal reflux disease without esophagitis: Secondary | ICD-10-CM | POA: Diagnosis not present

## 2018-08-11 NOTE — Assessment & Plan Note (Signed)
Has been under better control.

## 2018-08-11 NOTE — Assessment & Plan Note (Signed)
No changes made

## 2018-08-11 NOTE — Assessment & Plan Note (Signed)
Clinically euthyroid.  Declines labs today as they were normal in 02/2018

## 2018-08-11 NOTE — Patient Instructions (Signed)
Please schedule your next medicare wellness visit with me in 1 yr.  Continue to eat heart healthy diet (full of fruits, vegetables, whole grains, lean protein, water--limit salt, fat, and sugar intake) and increase physical activity as tolerated.  Continue doing brain stimulating activities (puzzles, reading, adult coloring books, staying active) to keep memory sharp.   Bring a copy of your living will and/or healthcare power of attorney to your next office visit.   Ms. Virginia Parrish , Thank you for taking time to come for your Medicare Wellness Visit. I appreciate your ongoing commitment to your health goals. Please review the following plan we discussed and let me know if I can assist you in the future.   These are the goals we discussed: Goals    . DIET - INCREASE WATER INTAKE    . Weight (lb) < 200 lb (90.7 kg)       This is a list of the screening recommended for you and due dates:  Health Maintenance  Topic Date Due  . Tetanus Vaccine  03/04/2019*  .  Hepatitis C: One time screening is recommended by Center for Disease Control  (CDC) for  adults born from [birth year] through 1963.   08/11/2019*  . Flu Shot  09/11/2018  . Pneumonia vaccines (2 of 2 - PPSV23) 03/04/2019  . Mammogram  05/28/2019  . Cologuard (Stool DNA test)  08/13/2020  . DEXA scan (bone density measurement)  Completed  *Topic was postponed. The date shown is not the original due date.    Health Maintenance After Age 66 After age 62, you are at a higher risk for certain long-term diseases and infections as well as injuries from falls. Falls are a major cause of broken bones and head injuries in people who are older than age 36. Getting regular preventive care can help to keep you healthy and well. Preventive care includes getting regular testing and making lifestyle changes as recommended by your health care provider. Talk with your health care provider about:  Which screenings and tests you should have. A screening is a  test that checks for a disease when you have no symptoms.  A diet and exercise plan that is right for you. What should I know about screenings and tests to prevent falls? Screening and testing are the best ways to find a health problem early. Early diagnosis and treatment give you the best chance of managing medical conditions that are common after age 28. Certain conditions and lifestyle choices may make you more likely to have a fall. Your health care provider may recommend:  Regular vision checks. Poor vision and conditions such as cataracts can make you more likely to have a fall. If you wear glasses, make sure to get your prescription updated if your vision changes.  Medicine review. Work with your health care provider to regularly review all of the medicines you are taking, including over-the-counter medicines. Ask your health care provider about any side effects that may make you more likely to have a fall. Tell your health care provider if any medicines that you take make you feel dizzy or sleepy.  Osteoporosis screening. Osteoporosis is a condition that causes the bones to get weaker. This can make the bones weak and cause them to break more easily.  Blood pressure screening. Blood pressure changes and medicines to control blood pressure can make you feel dizzy.  Strength and balance checks. Your health care provider may recommend certain tests to check your strength and balance while  standing, walking, or changing positions.  Foot health exam. Foot pain and numbness, as well as not wearing proper footwear, can make you more likely to have a fall.  Depression screening. You may be more likely to have a fall if you have a fear of falling, feel emotionally low, or feel unable to do activities that you used to do.  Alcohol use screening. Using too much alcohol can affect your balance and may make you more likely to have a fall. What actions can I take to lower my risk of falls? General  instructions  Talk with your health care provider about your risks for falling. Tell your health care provider if: ? You fall. Be sure to tell your health care provider about all falls, even ones that seem minor. ? You feel dizzy, sleepy, or off-balance.  Take over-the-counter and prescription medicines only as told by your health care provider. These include any supplements.  Eat a healthy diet and maintain a healthy weight. A healthy diet includes low-fat dairy products, low-fat (lean) meats, and fiber from whole grains, beans, and lots of fruits and vegetables. Home safety  Remove any tripping hazards, such as rugs, cords, and clutter.  Install safety equipment such as grab bars in bathrooms and safety rails on stairs.  Keep rooms and walkways well-lit. Activity   Follow a regular exercise program to stay fit. This will help you maintain your balance. Ask your health care provider what types of exercise are appropriate for you.  If you need a cane or walker, use it as recommended by your health care provider.  Wear supportive shoes that have nonskid soles. Lifestyle  Do not drink alcohol if your health care provider tells you not to drink.  If you drink alcohol, limit how much you have: ? 0-1 drink a day for women. ? 0-2 drinks a day for men.  Be aware of how much alcohol is in your drink. In the [place] one drink equals one typical bottle of beer (12 oz), one-half glass of wine (5 oz), or one shot of hard liquor (1 oz).  Do not use any products that contain nicotine or tobacco, such as cigarettes and e-cigarettes. If you need help quitting, ask your health care provider. Summary  Having a healthy lifestyle and getting preventive care can help to protect your health and wellness after age 87.  Screening and testing are the best way to find a health problem early and help you avoid having a fall. Early diagnosis and treatment give you the best chance for managing medical  conditions that are more common for people who are older than age 75.  Falls are a major cause of broken bones and head injuries in people who are older than age 26. Take precautions to prevent a fall at home.  Work with your health care provider to learn what changes you can make to improve your health and wellness and to prevent falls. This information is not intended to replace advice given to you by your health care provider. Make sure you discuss any questions you have with your health care provider. Document Released: 12/10/2016 Document Revised: 05/20/2018 Document Reviewed: 12/10/2016 Elsevier Patient Education  2020 [employer].

## 2018-08-11 NOTE — Patient Instructions (Addendum)
Great to see you!!  Happy birthday!!!!!!!!!!!!!!!!!!!!  Please call [facility] to schedule your mammogram.

## 2018-08-11 NOTE — Assessment & Plan Note (Signed)
Well controlled.  No changes made. 

## 2018-08-18 DIAGNOSIS — M19011 Primary osteoarthritis, right shoulder: Secondary | ICD-10-CM | POA: Diagnosis not present

## 2018-08-18 DIAGNOSIS — M25511 Pain in right shoulder: Secondary | ICD-10-CM | POA: Diagnosis not present

## 2018-08-19 ENCOUNTER — Other Ambulatory Visit: Payer: Self-pay

## 2018-08-19 ENCOUNTER — Ambulatory Visit (HOSPITAL_BASED_OUTPATIENT_CLINIC_OR_DEPARTMENT_OTHER)
Admission: RE | Admit: 2018-08-19 | Discharge: 2018-08-19 | Disposition: A | Discharge status: Home / Self Care | Payer: Medicare Other | Source: Ambulatory Visit | Attending: Family Medicine | Admitting: Family Medicine

## 2018-08-19 DIAGNOSIS — Z13820 Encounter for screening for osteoporosis: Secondary | ICD-10-CM | POA: Diagnosis not present

## 2018-08-19 DIAGNOSIS — Z780 Asymptomatic menopausal state: Secondary | ICD-10-CM

## 2018-08-19 DIAGNOSIS — Z1231 Encounter for screening mammogram for malignant neoplasm of breast: Secondary | ICD-10-CM | POA: Insufficient documentation

## 2018-08-19 DIAGNOSIS — Z87891 Personal history of nicotine dependence: Secondary | ICD-10-CM | POA: Diagnosis not present

## 2018-08-19 DIAGNOSIS — E039 Hypothyroidism, unspecified: Secondary | ICD-10-CM | POA: Diagnosis not present

## 2018-08-19 DIAGNOSIS — M8580 Other specified disorders of bone density and structure, unspecified site: Secondary | ICD-10-CM | POA: Insufficient documentation

## 2018-08-19 DIAGNOSIS — M85851 Other specified disorders of bone density and structure, right thigh: Secondary | ICD-10-CM | POA: Diagnosis not present

## 2018-08-19 DIAGNOSIS — Z1239 Encounter for other screening for malignant neoplasm of breast: Secondary | ICD-10-CM

## 2018-08-23 DIAGNOSIS — L821 Other seborrheic keratosis: Secondary | ICD-10-CM | POA: Diagnosis not present

## 2018-08-23 DIAGNOSIS — L820 Inflamed seborrheic keratosis: Secondary | ICD-10-CM | POA: Diagnosis not present

## 2018-08-23 DIAGNOSIS — D225 Melanocytic nevi of trunk: Secondary | ICD-10-CM | POA: Diagnosis not present

## 2018-09-01 DIAGNOSIS — H43813 Vitreous degeneration, bilateral: Secondary | ICD-10-CM | POA: Diagnosis not present

## 2018-09-01 DIAGNOSIS — H04123 Dry eye syndrome of bilateral lacrimal glands: Secondary | ICD-10-CM | POA: Diagnosis not present

## 2018-09-01 DIAGNOSIS — H52203 Unspecified astigmatism, bilateral: Secondary | ICD-10-CM | POA: Diagnosis not present

## 2018-09-01 DIAGNOSIS — Z961 Presence of intraocular lens: Secondary | ICD-10-CM | POA: Diagnosis not present

## 2018-09-09 NOTE — Progress Notes (Signed)
AWV was done in person. Documentation indicating virtual visit was made in error.

## 2018-09-23 DIAGNOSIS — M19011 Primary osteoarthritis, right shoulder: Secondary | ICD-10-CM

## 2018-09-23 HISTORY — DX: Primary osteoarthritis, right shoulder: M19.011

## 2018-09-27 DIAGNOSIS — M25511 Pain in right shoulder: Secondary | ICD-10-CM | POA: Diagnosis not present

## 2018-09-27 DIAGNOSIS — M19011 Primary osteoarthritis, right shoulder: Secondary | ICD-10-CM | POA: Diagnosis not present

## 2018-10-04 ENCOUNTER — Encounter: Payer: Self-pay | Admitting: Physical Therapy

## 2018-10-04 ENCOUNTER — Other Ambulatory Visit: Payer: Self-pay

## 2018-10-04 ENCOUNTER — Ambulatory Visit: Payer: Medicare Other | Attending: Orthopedic Surgery | Admitting: Physical Therapy

## 2018-10-04 DIAGNOSIS — M25611 Stiffness of right shoulder, not elsewhere classified: Secondary | ICD-10-CM | POA: Insufficient documentation

## 2018-10-04 DIAGNOSIS — M25511 Pain in right shoulder: Secondary | ICD-10-CM

## 2018-10-04 NOTE — Therapy (Signed)
[address] [place], [place], [address] Phone: [phone]   Fax:  [phone]  Physical Therapy Evaluation  Patient Details  Name: Virginia Parrish MRN: [id number] Date of Birth: 1946-01-06 Referring Provider (PT): Supple   Encounter Date: 10/04/2018  PT End of Session - 10/04/18 1040    Visit Number  1    Date for PT Re-Evaluation  12/04/18    PT Start Time  1010    PT Stop Time  1045    PT Time Calculation (min)  35 min    Activity Tolerance  Patient tolerated treatment well    Behavior During Therapy  [facility] for tasks assessed/performed       Past Medical History:  Diagnosis Date  . Allergy   . Colon polyp    benign  . GERD (gastroesophageal reflux disease)   . History of chicken pox   . Hypertension     Past Surgical History:  Procedure Laterality Date  . ABDOMINAL HYSTERECTOMY  2007   Complete due to cervical cancer  . CHOLECYSTECTOMY  2003  . EYE SURGERY Bilateral 05/12/2018   cataract sx    There were no vitals filed for this visit.   Subjective Assessment - 10/04/18 1014    Subjective  Patient reports that she is unsure of a cause of the right shoulder pain, has had a cortisone injection, she had x-rays that showed arthritis and decreased cartilage.  Her goals are to get stronger with less pain    Limitations  House hold activities;Lifting    Patient Stated Goals  be stronger havce less pain    Currently in Pain?  Yes    Pain Score  0-No pain    Pain Location  Shoulder    Pain Orientation  Right    Pain Descriptors / Indicators  Aching    Pain Type  Acute pain    Pain Onset  More than a month ago    Pain Frequency  Intermittent    Aggravating Factors   reaching up, reaching behind pain up to 5/10, sometimes difficult to sleep    Pain Relieving Factors  rest helps, cortisone helps    Effect of Pain on Daily Activities  limits ADL's, reports that she asks husband for help more          [facility] PT Assessment - 10/04/18 0001      Assessment   Medical Diagnosis  right shoulder pain    Referring Provider (PT)  Supple    Onset Date/Surgical Date  09/03/18    Hand Dominance  Right      Precautions   Precautions  None      Balance Screen   Has the patient fallen in the past 6 months  No    Has the patient had a decrease in activity level because of a fear of falling?   No    Is the patient reluctant to leave their home because of a fear of falling?   No      Home Environment   Additional Comments  does the housework, some yardowrk, gardening      Prior Function   Level of Independence  Independent    Vocation  Part time employment    Leisure  walking      Posture/Postural Control   Posture Comments  fwd head, rounded shoulders      ROM / Strength   AROM / PROM / Strength  AROM;Strength;PROM  AROM   AROM Assessment Site  Shoulder    Right/Left Shoulder  Right    Right Shoulder Flexion  100 Degrees    Right Shoulder ABduction  100 Degrees    Right Shoulder Internal Rotation  30 Degrees    Right Shoulder External Rotation  60 Degrees      PROM   PROM Assessment Site  Shoulder    Right/Left Shoulder  Right    Right Shoulder Flexion  135 Degrees    Right Shoulder ABduction  125 Degrees    Right Shoulder Internal Rotation  40 Degrees    Right Shoulder External Rotation  71 Degrees      Strength   Overall Strength Comments  4-/5 with some pain      Palpation   Palpation comment  has tightness in the upper traps, very tender in the biceps origin      Special Tests   Other special tests  + impingement                Objective measurements completed on examination: See above findings.              PT Education - 10/04/18 1039    Education Details  HEP for ROM and scapular stabilization with yellow and red tband    Person(s) Educated  Patient    Methods  Explanation;Demonstration;Tactile cues;Verbal cues;Handout     Comprehension  Verbalized understanding;Returned demonstration;Verbal cues required;Tactile cues required          PT Long Term Goals - 10/04/18 1044      PT LONG TERM GOAL #1   Title  understand posture and body mechanics    Time  4    Period  Weeks    Status  New      PT LONG TERM GOAL #2   Title  independent with HEP    Time  4    Period  Weeks    Status  New             Plan - 10/04/18 1041    Clinical Impression Statement  Patient reports that she has had right shoulder pain for over a year, she reports x-rays showed arthritis and decreased cartilage.  She has had a cortisone injection in the past that helped.  She is right handed.  She is missing ROM actively with pain, most limited with flexion and IR.  She wants to try to have a good HEP to prolong her function and decrease pain.    Stability/Clinical Decision Making  Stable/Uncomplicated    Clinical Decision Making  Low    Rehab Potential  Good    PT Frequency  1x / week    PT Duration  4 weeks    PT Treatment/Interventions  ADLs/Self Care Home Management;Therapeutic activities;Neuromuscular re-education;Therapeutic exercise;Manual techniques;Passive range of motion    PT Next Visit Plan  Will see patient 1-2x to give a good HEP    Consulted and Agree with Plan of Care  Patient       Patient will benefit from skilled therapeutic intervention in order to improve the following deficits and impairments:  Improper body mechanics, Pain, Postural dysfunction, Increased muscle spasms, Decreased range of motion, Decreased strength, Impaired UE functional use, Impaired flexibility  Visit Diagnosis: Acute pain of right shoulder - Plan: PT plan of care cert/re-cert  Stiffness of right shoulder, not elsewhere classified - Plan: PT plan of care cert/re-cert     Problem List Patient Active Problem  List   Diagnosis Date Noted  . Hypothyroidism 01/12/2017  . GERD (gastroesophageal reflux disease) 01/12/2017  . HTN  (hypertension) 01/12/2017  . Gout 01/12/2017    Sumner Boast., PT 10/04/2018, 10:46 AM  [address] [place], [place], [address] Phone: [phone]   Fax:  [phone]  Name: Liani Lebeouf MRN: [id number] Date of Birth: 06-17-1945

## 2018-10-13 ENCOUNTER — Ambulatory Visit: Payer: Medicare Other | Attending: Orthopedic Surgery | Admitting: Physical Therapy

## 2018-10-13 DIAGNOSIS — M25511 Pain in right shoulder: Secondary | ICD-10-CM | POA: Insufficient documentation

## 2018-10-13 DIAGNOSIS — M25611 Stiffness of right shoulder, not elsewhere classified: Secondary | ICD-10-CM | POA: Insufficient documentation

## 2018-10-25 ENCOUNTER — Other Ambulatory Visit: Payer: Self-pay

## 2018-10-25 ENCOUNTER — Encounter: Payer: Self-pay | Admitting: Physical Therapy

## 2018-10-25 ENCOUNTER — Ambulatory Visit: Payer: Medicare Other | Admitting: Physical Therapy

## 2018-10-25 DIAGNOSIS — M25511 Pain in right shoulder: Secondary | ICD-10-CM

## 2018-10-25 DIAGNOSIS — M25611 Stiffness of right shoulder, not elsewhere classified: Secondary | ICD-10-CM | POA: Diagnosis not present

## 2018-10-25 NOTE — Therapy (Signed)
[address] [place], [place], [address] Phone: [phone]   Fax:  [phone]  Physical Therapy Treatment  Patient Details  Name: Virginia Parrish MRN: [id number] Date of Birth: [birth year] Referring Provider (PT): Supple   Encounter Date: 10/25/2018  PT End of Session - 10/25/18 1146    Visit Number  2    Date for PT Re-Evaluation  12/04/18    PT Start Time  1055    PT Stop Time  1145    PT Time Calculation (min)  50 min    Activity Tolerance  Patient tolerated treatment well    Behavior During Therapy  [facility] for tasks assessed/performed       Past Medical History:  Diagnosis Date  . Allergy   . Colon polyp    benign  . GERD (gastroesophageal reflux disease)   . History of chicken pox   . Hypertension     Past Surgical History:  Procedure Laterality Date  . ABDOMINAL HYSTERECTOMY  2007   Complete due to cervical cancer  . CHOLECYSTECTOMY  2003  . EYE SURGERY Bilateral 05/12/2018   cataract sx    There were no vitals filed for this visit.  Subjective Assessment - 10/25/18 1055    Subjective  not currently taking anything for shoulder pain, R shoulder gets a little more and more tender ever week as cortisone shot wears off    Pain Score  3     Pain Location  Shoulder    Pain Orientation  Right                       [facility] Adult PT Treatment/Exercise - 10/25/18 0001      Exercises   Exercises  Shoulder      Shoulder Exercises: Standing   Horizontal ABduction  Strengthening;Right;10 reps;Theraband    Theraband Level (Shoulder Horizontal ABduction)  Level 2 (Red)    External Rotation  Strengthening;Right;10 reps;Theraband   2 sets   Theraband Level (Shoulder External Rotation)  Level 2 (Red)    Internal Rotation  Strengthening;Right;10 reps;Theraband   2 sets   Theraband Level (Shoulder Internal Rotation)  Level 2 (Red)    Flexion  Strengthening;Right;10 reps    Theraband Level (Shoulder Flexion)  Level 2 (Red)    ABduction  Strengthening;Right;10 reps;Theraband    Theraband Level (Shoulder ABduction)  Level 2 (Red)    Extension  Strengthening;Both;10 reps;Theraband   2 sets   Theraband Level (Shoulder Extension)  Level 2 (Red)    Row  Strengthening;Both;10 reps;Theraband   2 sets   Theraband Level (Shoulder Row)  Level 2 (Red)    Other Standing Exercises  wall slides 1# R shoulder flexion, side to side, scaption CW/CCW      Shoulder Exercises: ROM/Strengthening   UBE (Upper Arm Bike)  lvl 3 55fwd/3bck      Modalities   Modalities  Cryotherapy      Cryotherapy   Number Minutes Cryotherapy  10 Minutes    Cryotherapy Location  Shoulder    Type of Cryotherapy  Ice pack                  PT Long Term Goals - 10/04/18 1044      PT LONG TERM GOAL #1   Title  understand posture and body mechanics    Time  4    Period  Weeks    Status  On-going  PT LONG TERM GOAL #2   Title  independent with HEP    Time  4    Period  Weeks    Status  on-going           Plan - 10/25/18 1146    Clinical Impression Statement  pt tolerated treatment well despite pain elicited from exercises. pt. able to demonstrate HEP exercises on handout given to pt to strengthen muscles around R shoulder. tactile and verbal cues needed to ensure proper form for ER/IR    PT Treatment/Interventions  ADLs/Self Care Home Management;Therapeutic activities;Neuromuscular re-education;Therapeutic exercise;Manual techniques;Passive range of motion    PT Home Exercise Plan  HEP was exercises performed today with theraband.       Patient will benefit from skilled therapeutic intervention in order to improve the following deficits and impairments:  Improper body mechanics, Pain, Postural dysfunction, Increased muscle spasms, Decreased range of motion, Decreased strength, Impaired UE functional use, Impaired flexibility  Visit Diagnosis: Acute pain of right  shoulder  Stiffness of right shoulder, not elsewhere classified     Problem List Patient Active Problem List   Diagnosis Date Noted  . Hypothyroidism 01/12/2017  . GERD (gastroesophageal reflux disease) 01/12/2017  . HTN (hypertension) 01/12/2017  . Gout 01/12/2017    Virginia Parrish, SPTA 10/25/2018, 11:49 AM  [address] [place], [place], [address] Phone: [phone]   Fax:  [phone]  Name: Virginia Parrish MRN: [id number] Date of Birth: 18-Dec-1945

## 2018-10-26 ENCOUNTER — Ambulatory Visit (INDEPENDENT_AMBULATORY_CARE_PROVIDER_SITE_OTHER): Payer: Medicare Other | Admitting: Behavioral Health

## 2018-10-26 DIAGNOSIS — Z23 Encounter for immunization: Secondary | ICD-10-CM | POA: Diagnosis not present

## 2018-10-26 NOTE — Progress Notes (Signed)
Patient presents in clinic today for Influenza vaccination. IM injection was given in the left deltoid. Patient tolerated the injection well. No signs or symptoms of a reaction were noted prior to patient leaving the nurse visit. 

## 2019-01-08 ENCOUNTER — Other Ambulatory Visit: Payer: Self-pay | Admitting: Family Medicine

## 2019-01-25 ENCOUNTER — Encounter: Payer: Self-pay | Admitting: Family Medicine

## 2019-01-25 NOTE — Progress Notes (Signed)
Virtual Visit via Video   Due to the COVID-19 pandemic, this visit was completed with telemedicine (audio/video) technology to reduce patient and provider exposure as well as to preserve personal protective equipment.   I connected with Virginia Parrish by a video enabled telemedicine application and verified that I am speaking with the correct person using two identifiers. Location patient: Home Location provider: [facility], Office Persons participating in the virtual visit: Virginia Parrish, Arnette Norris, MD   I discussed the limitations of evaluation and management by telemedicine and the availability of in person appointments. The patient expressed understanding and agreed to proceed.  Care Team   Patient Care Team: Lucille Passy, MD as PCP - General (Family Medicine)  Subjective:   HPI: Patient is connecting virtually to discuss a refill for Allopurinol for her Gout. Patient explains that she has not had the medication since 2016.She has had 2 flare-ups in the past 6 months (one in her toe and one her ankle), which she has taken Ibuprofen for. This medication is quite helpful.   Received the following mychart message from patient:  '"I need to refill my Allopurinol which I have not had filled through this office yet.  I have had a couple of attacks in my toe over the last 6 months.  Could you please call it in to Kristopher Oppenheim at [facility].Virginia Parrish"  Not currently in pain. No results found for: [facility] Lab Results  Component Value Date   CREATININE 0.82 03/03/2018   Uric acid is pending- drawn yesterday.  Review of Systems  Constitutional: Negative for fever and malaise/fatigue.  HENT: Negative for congestion and hearing loss.   Eyes: Negative for blurred vision, discharge and redness.  Respiratory: Negative for cough and shortness of breath.   Cardiovascular: Negative for chest pain, palpitations and leg swelling.  Gastrointestinal: Negative for  abdominal pain and heartburn.  Genitourinary: Negative for dysuria.  Musculoskeletal: Positive for joint pain. Negative for falls.  Skin: Negative for rash.  Neurological: Negative for loss of consciousness and headaches.  Endo/Heme/Allergies: Does not bruise/bleed easily.  Psychiatric/Behavioral: Negative for depression.  All other systems reviewed and are negative.    Patient Active Problem List   Diagnosis Date Noted  . Hypothyroidism 01/12/2017  . GERD (gastroesophageal reflux disease) 01/12/2017  . HTN (hypertension) 01/12/2017  . Gout 01/12/2017    Social History   Tobacco Use  . Smoking status: Never Smoker  . Smokeless tobacco: Never Used  Substance Use Topics  . Alcohol use: Yes    Comment: wine with dinner    Current Outpatient Medications:  .  allopurinol (ZYLOPRIM) 100 MG tablet, Take 1 tablet (100 mg total) by mouth daily., Disp: 90 tablet, Rfl: 3 .  amoxicillin (AMOXIL) 500 MG capsule, Take 4 prior to dental procedure, Disp: 4 capsule, Rfl: 0 .  aspirin (ASPIR-LOW) 81 MG EC tablet, , Disp: , Rfl:  .  Cholecalciferol (VITAMIN D-3) 5000 units TABS, Take 1 tablet by mouth daily., Disp: , Rfl:  .  clotrimazole-betamethasone (LOTRISONE) cream, Apply 1 application topically as needed., Disp: , Rfl:  .  levothyroxine (SYNTHROID, LEVOTHROID) 50 MCG tablet, Take 1 tablet (50 mcg total) by mouth daily., Disp: 90 tablet, Rfl: 3 .  pantoprazole (PROTONIX) 40 MG tablet, TAKE ONE TABLET BY MOUTH DAILY, Disp: 90 tablet, Rfl: 3 .  spironolactone-hydrochlorothiazide (ALDACTAZIDE) 25-25 MG tablet, Take 1 tablet by mouth daily., Disp: 90 tablet, Rfl: 2 .  vitamin E 1000 UNIT capsule,  Take 1,000 Units by mouth daily., Disp: , Rfl:   No Known Allergies  Objective:  Ht 5\' 4"  (1.626 m)   BMI 40.34 kg/m   VITALS: Per patient if applicable, see vitals. GENERAL: Alert, appears well and in no acute distress. HEENT: Atraumatic, conjunctiva clear, no obvious abnormalities on  inspection of external nose and ears. NECK: Normal movements of the head and neck. CARDIOPULMONARY: No increased WOB. Speaking in clear sentences. I:E ratio WNL.  MS: Moves all visible extremities without noticeable abnormality. PSYCH: Pleasant and cooperative, well-groomed. Speech normal rate and rhythm. Affect is appropriate. Insight and judgement are appropriate. Attention is focused, linear, and appropriate.  NEURO: CN grossly intact. Oriented as arrived to appointment on time with no prompting. Moves both UE equally.  SKIN: No obvious lesions, wounds, erythema, or cyanosis noted on face or hands.  Depression screen [facility] 2/9 08/11/2018 03/03/2018 05/20/2017  Decreased Interest 0 0 0  Down, Depressed, Hopeless 0 0 0  PHQ - 2 Score 0 0 0     . COVID-19 Education: The signs and symptoms of COVID-19 were discussed with the patient and how to seek care for testing if needed. The importance of social distancing was discussed today. . Reviewed expectations re: course of current medical issues. . Discussed self-management of symptoms. . Outlined signs and symptoms indicating need for more acute intervention. . Patient verbalized understanding and all questions were answered. Marland Kitchen Health Maintenance issues including appropriate healthy diet, exercise, and smoking avoidance were discussed with patient. . See orders for this visit as documented in the electronic medical record.  Arnette Norris, MD  Records requested if needed. Time spent: 25 minutes, of which >50% was spent in obtaining information about her symptoms, reviewing her previous labs, evaluations, and treatments, counseling her about her condition (please see the discussed topics above), and developing a plan to further investigate it; she had a number of questions which I addressed.   Lab Results  Component Value Date   WBC 5.7 03/03/2018   HGB 14.6 03/03/2018   HCT 44.5 03/03/2018   PLT 250.0 03/03/2018   GLUCOSE 97 03/03/2018   CHOL 188  03/03/2018   TRIG 70.0 03/03/2018   HDL 68.60 03/03/2018   LDLCALC 105 (H) 03/03/2018   ALT 10 03/03/2018   AST 16 03/03/2018   NA 141 03/03/2018   K 4.4 03/03/2018   CL 104 03/03/2018   CREATININE 0.82 03/03/2018   BUN 19 03/03/2018   CO2 31 03/03/2018   TSH 2.40 03/03/2018    Lab Results  Component Value Date   TSH 2.40 03/03/2018   Lab Results  Component Value Date   WBC 5.7 03/03/2018   HGB 14.6 03/03/2018   HCT 44.5 03/03/2018   MCV 88.4 03/03/2018   PLT 250.0 03/03/2018   Lab Results  Component Value Date   NA 141 03/03/2018   K 4.4 03/03/2018   CO2 31 03/03/2018   GLUCOSE 97 03/03/2018   BUN 19 03/03/2018   CREATININE 0.82 03/03/2018   BILITOT 0.5 03/03/2018   ALKPHOS 59 03/03/2018   AST 16 03/03/2018   ALT 10 03/03/2018   PROT 6.8 03/03/2018   ALBUMIN 4.3 03/03/2018   CALCIUM 9.9 03/03/2018   GFR 68.81 03/03/2018   Lab Results  Component Value Date   CHOL 188 03/03/2018   Lab Results  Component Value Date   HDL 68.60 03/03/2018   Lab Results  Component Value Date   LDLCALC 105 (H) 03/03/2018   Lab Results  Component Value Date   TRIG 70.0 03/03/2018   Lab Results  Component Value Date   CHOLHDL 3 03/03/2018   No results found for: HGBA1C     Assessment & Plan:   Problem List Items Addressed This Visit      Active Problems   Gout - Primary    Deteriorated unfortunately.  Has had more attacks in the past 6 months. The nature of gout is fully explained, including dietary relationship, acute and interval phase and treatment of both. Long term complications such as kidney stones, tophi and arthritis are discussed. Avoidance of alcohol recommended, and written literature is given along with a low purine diet. Indications for the use of allopurinol for prophylaxis and the use of colchicine to prevent or treat flare-ups is also discussed. Proper use of indomethacin for acute attacks discussed, and its side effects. Call if further attacks  occur, or this one does not resolve promptly.  eRx refill of allopurinol sent to pharmacy on file.  The patient indicates understanding of these issues and agrees with the plan.          I have discontinued Virginia Wheelwright "Judy"'s Calcium Carb-Cholecalciferol (LIQUID CALCIUM WITH D3 PO). I have also changed her allopurinol. Additionally, I am having her maintain her vitamin E, Vitamin D-3, clotrimazole-betamethasone, amoxicillin, levothyroxine, spironolactone-hydrochlorothiazide, aspirin, and pantoprazole.  Meds ordered this encounter  Medications  . allopurinol (ZYLOPRIM) 100 MG tablet    Sig: Take 1 tablet (100 mg total) by mouth daily.    Dispense:  90 tablet    Refill:  3     Arnette Norris, MD

## 2019-01-26 ENCOUNTER — Encounter: Payer: Self-pay | Admitting: Family Medicine

## 2019-01-26 MED ORDER — ALLOPURINOL 100 MG PO TABS: 100.0000 mg | ORAL_TABLET | Freq: Every day | ORAL | 3 refills | Status: DC

## 2019-01-26 NOTE — Assessment & Plan Note (Addendum)
Deteriorated unfortunately.  Has had more attacks in the past 6 months. The nature of gout is fully explained, including dietary relationship, acute and interval phase and treatment of both. Long term complications such as kidney stones, tophi and arthritis are discussed. Avoidance of alcohol recommended, and written literature is given along with a low purine diet. Indications for the use of allopurinol for prophylaxis and the use of colchicine to prevent or treat flare-ups is also discussed. Proper use of NSAIDs for acute attacks discussed, and its side effects. Call if further attacks occur, or this one does not resolve promptly.  eRx refill of allopurinol sent to pharmacy on file.  Uric acid level pending.  Currently asymptomatic.  The patient indicates understanding of these issues and agrees with the plan.

## 2019-01-27 ENCOUNTER — Encounter: Payer: Self-pay | Admitting: Family Medicine

## 2019-01-27 ENCOUNTER — Ambulatory Visit (INDEPENDENT_AMBULATORY_CARE_PROVIDER_SITE_OTHER): Payer: Medicare Other | Admitting: Family Medicine

## 2019-01-27 VITALS — Ht 64.0 in

## 2019-01-27 DIAGNOSIS — M1A9XX1 Chronic gout, unspecified, with tophus (tophi): Secondary | ICD-10-CM | POA: Diagnosis not present

## 2019-01-27 NOTE — Patient Instructions (Signed)

## 2019-02-01 ENCOUNTER — Ambulatory Visit: Payer: Medicare Other | Admitting: Family Medicine

## 2019-02-07 ENCOUNTER — Encounter: Payer: Self-pay | Admitting: Family Medicine

## 2019-02-09 DIAGNOSIS — R0602 Shortness of breath: Secondary | ICD-10-CM | POA: Diagnosis not present

## 2019-02-09 DIAGNOSIS — K591 Functional diarrhea: Secondary | ICD-10-CM | POA: Diagnosis not present

## 2019-02-25 ENCOUNTER — Other Ambulatory Visit: Payer: Self-pay | Admitting: Family Medicine

## 2019-03-13 ENCOUNTER — Ambulatory Visit: Payer: Medicare Other

## 2019-03-14 ENCOUNTER — Encounter: Payer: Self-pay | Admitting: Family Medicine

## 2019-03-18 ENCOUNTER — Ambulatory Visit: Payer: Medicare Other

## 2019-03-19 ENCOUNTER — Ambulatory Visit: Payer: Medicare Other

## 2019-03-24 ENCOUNTER — Encounter: Payer: Self-pay | Admitting: Family Medicine

## 2019-05-24 ENCOUNTER — Other Ambulatory Visit: Payer: Self-pay

## 2019-05-24 MED ORDER — LEVOTHYROXINE SODIUM 50 MCG PO TABS: 50.0000 ug | ORAL_TABLET | Freq: Every day | ORAL | 1 refills | Status: DC

## 2019-07-06 ENCOUNTER — Encounter: Payer: Self-pay | Admitting: Physical Therapy

## 2019-08-17 ENCOUNTER — Encounter: Payer: Medicare Other | Admitting: Family Medicine

## 2019-08-17 ENCOUNTER — Ambulatory Visit: Payer: Medicare Other | Admitting: *Deleted

## 2019-08-29 ENCOUNTER — Other Ambulatory Visit: Payer: Self-pay

## 2019-08-29 ENCOUNTER — Encounter: Payer: Self-pay | Admitting: Family

## 2019-08-29 ENCOUNTER — Ambulatory Visit (INDEPENDENT_AMBULATORY_CARE_PROVIDER_SITE_OTHER): Payer: Medicare Other

## 2019-08-29 ENCOUNTER — Ambulatory Visit (INDEPENDENT_AMBULATORY_CARE_PROVIDER_SITE_OTHER): Payer: Medicare Other | Admitting: Family

## 2019-08-29 VITALS — BP 122/80 | HR 63 | Temp 97.2°F | Ht 64.0 in | Wt 240.6 lb

## 2019-08-29 DIAGNOSIS — M79672 Pain in left foot: Secondary | ICD-10-CM

## 2019-08-29 DIAGNOSIS — M7732 Calcaneal spur, left foot: Secondary | ICD-10-CM

## 2019-08-29 DIAGNOSIS — R600 Localized edema: Secondary | ICD-10-CM

## 2019-08-29 DIAGNOSIS — M19072 Primary osteoarthritis, left ankle and foot: Secondary | ICD-10-CM | POA: Diagnosis not present

## 2019-08-29 NOTE — Progress Notes (Signed)
Acute Office Visit  Subjective:    Patient ID: Virginia Parrish, female    DOB: 01/14/1946, 74 y.o.   MRN: [id number]  Chief Complaint  Patient presents with  . Foot Injury    Left heel an ankle swollen last 2 weeks//tried ibuprofen with no relief    HPI Patient is in today WITH c/o pain in her left foot and heel x 2 weeks and worsening. She reports she stopped taking her Allopurinol for Gout because she has not had a flare in some times. Denies any injury. Pain is worse with walking and with wearing sneakers. Pain 6/10, aching. Has been taking 200-800 mg of Ibuprofen that helps some. No known injury  Past Medical History:  Diagnosis Date  . Allergy   . Colon polyp    benign  . GERD (gastroesophageal reflux disease)   . History of chicken pox   . Hypertension     Past Surgical History:  Procedure Laterality Date  . ABDOMINAL HYSTERECTOMY  2007   Complete due to cervical cancer  . CHOLECYSTECTOMY  2003  . EYE SURGERY Bilateral 05/12/2018   cataract sx    Family History  Problem Relation Age of Onset  . Arthritis Mother   . Hearing loss Mother   . Hyperlipidemia Mother   . Hypertension Mother   . Kidney disease Mother   . Arthritis Father   . Heart disease Father   . Drug abuse Maternal Grandmother   . Arthritis Maternal Grandfather   . Drug abuse Paternal Grandfather     Social History   Socioeconomic History  . Marital status: Married    Spouse name: Not on file  . Number of children: Not on file  . Years of education: Not on file  . Highest education level: Not on file  Occupational History  . Not on file  Tobacco Use  . Smoking status: Never Smoker  . Smokeless tobacco: Never Used  Vaping Use  . Vaping Use: Never used  Substance and Sexual Activity  . Alcohol use: Yes    Comment: wine with dinner  . Drug use: No  . Sexual activity: Yes    Partners: Male  Other Topics Concern  . Not on file  Social History Narrative  . Not on file   Social  Determinants of Health   Financial Resource Strain:   . Difficulty of Paying Living Expenses:   Food Insecurity:   . Worried About Charity fundraiser in the Last Year:   . Arboriculturist in the Last Year:   Transportation Needs:   . Film/video editor (Medical):   Marland Kitchen Lack of Transportation (Non-Medical):   Physical Activity:   . Days of Exercise per Week:   . Minutes of Exercise per Session:   Stress:   . Feeling of Stress :   Social Connections:   . Frequency of Communication with Friends and Family:   . Frequency of Social Gatherings with Friends and Family:   . Attends Religious Services:   . Active Member of Clubs or Organizations:   . Attends Archivist Meetings:   Marland Kitchen Marital Status:   Intimate Partner Violence:   . Fear of Current or Ex-Partner:   . Emotionally Abused:   Marland Kitchen Physically Abused:   . Sexually Abused:     Outpatient Medications Prior to Visit  Medication Sig Dispense Refill  . allopurinol (ZYLOPRIM) 100 MG tablet Take 1 tablet (100 mg total) by mouth daily. [place]  tablet 3  . aspirin (ASPIR-LOW) 81 MG EC tablet     . Cholecalciferol (VITAMIN D-3) 5000 units TABS Take 1 tablet by mouth daily.    . clotrimazole-betamethasone (LOTRISONE) cream Apply 1 application topically as needed.    Marland Kitchen levothyroxine (SYNTHROID) 50 MCG tablet Take 1 tablet (50 mcg total) by mouth daily. 90 tablet 1  . pantoprazole (PROTONIX) 40 MG tablet TAKE ONE TABLET BY MOUTH DAILY 90 tablet 3  . spironolactone-hydrochlorothiazide (ALDACTAZIDE) 25-25 MG tablet TAKE ONE TABLET BY MOUTH DAILY 90 tablet 1  . vitamin E 1000 UNIT capsule Take 1,000 Units by mouth daily.    Marland Kitchen amoxicillin (AMOXIL) 500 MG capsule Take 4 prior to dental procedure (Patient not taking: Reported on 08/29/2019) 4 capsule 0   No facility-administered medications prior to visit.    No Known Allergies  Review of Systems  Musculoskeletal: Positive for gait problem.       Left foot/heel pain and swelling    Skin: Negative.   All other systems reviewed and are negative.      Objective:    Physical Exam Vitals reviewed.  Constitutional:      Appearance: Normal appearance.  Cardiovascular:     Rate and Rhythm: Normal rate and regular rhythm.     Pulses: Normal pulses.     Heart sounds: Normal heart sounds.  Pulmonary:     Effort: Pulmonary effort is normal.     Breath sounds: Normal breath sounds.  Musculoskeletal:     Right foot: Normal range of motion.     Left foot: Normal range of motion.       Feet:  Feet:     Right foot:     Skin integrity: Skin integrity normal.     Toenail Condition: Right toenails are normal.     Left foot:     Skin integrity: Skin integrity normal.     Toenail Condition: Left toenails are normal.  Skin:    General: Skin is warm and dry.  Neurological:     General: No focal deficit present.     Mental Status: She is alert and oriented to person, place, and time.  Psychiatric:        Mood and Affect: Mood normal.     BP 122/80   Pulse 63   Temp (!) 97.2 F (36.2 C) (Tympanic)   Ht 5\' 4"  (1.626 m)   Wt 240 lb 9.6 oz (109.1 kg)   SpO2 100%   BMI 41.30 kg/m  Wt Readings from Last 3 Encounters:  08/29/19 240 lb 9.6 oz (109.1 kg)  08/11/18 235 lb (106.6 kg)  08/11/18 235 lb 9.6 oz (106.9 kg)    Health Maintenance Due  Topic Date Due  . Hepatitis C Screening  Never done  . COVID-19 Vaccine (1) Never done  . TETANUS/TDAP  Never done  . PNA vac Low Risk Adult (2 of 2 - PPSV23) 03/04/2019    There are no preventive care reminders to display for this patient.   Lab Results  Component Value Date   TSH 2.40 03/03/2018   Lab Results  Component Value Date   WBC 5.7 03/03/2018   HGB 14.6 03/03/2018   HCT 44.5 03/03/2018   MCV 88.4 03/03/2018   PLT 250.0 03/03/2018   Lab Results  Component Value Date   NA 141 03/03/2018   K 4.4 03/03/2018   CO2 31 03/03/2018   GLUCOSE 97 03/03/2018   BUN 19 03/03/2018   CREATININE 0.82  03/03/2018   BILITOT 0.5 03/03/2018   ALKPHOS 59 03/03/2018   AST 16 03/03/2018   ALT 10 03/03/2018   PROT 6.8 03/03/2018   ALBUMIN 4.3 03/03/2018   CALCIUM 9.9 03/03/2018   GFR 68.81 03/03/2018   Lab Results  Component Value Date   CHOL 188 03/03/2018   Lab Results  Component Value Date   HDL 68.60 03/03/2018   Lab Results  Component Value Date   LDLCALC 105 (H) 03/03/2018   Lab Results  Component Value Date   TRIG 70.0 03/03/2018   Lab Results  Component Value Date   CHOLHDL 3 03/03/2018   No results found for: HGBA1C     Assessment & Plan:   Problem List Items Addressed This Visit    None    Visit Diagnoses    Pain of left heel    -  Primary   Relevant Orders   DG Foot Complete Left   Ambulatory referral to Orthopedic Surgery   Edema of left foot       Relevant Orders   DG Foot Complete Left   Ambulatory referral to Orthopedic Surgery   Heel spur, left       Relevant Orders   Ambulatory referral to Orthopedic Surgery       Call the office with any questions or concerns. Ibuprofen as needed for pain until you see Ortho.   Kennyth Arnold, FNP

## 2019-08-29 NOTE — Patient Instructions (Signed)
Heel Spur  A heel spur is a bony growth that forms on the bottom of the heel bone (calcaneus). Heel spurs are common. They often cause inflammation in the band of tissue that connects the toes to the heel bone (plantar fascia). This may cause pain on the bottom of the foot, near the heel. Many people with plantar fasciitis also have heel spurs. However, spurs are not the cause of plantar fasciitis pain. What are the causes? The exact cause of heel spurs is not known. They may be caused by:  Pressure on the heel bone.  Bands of tissue (tendons) pulling on the heel bone. What increases the risk? You are more likely to develop this condition if you:  Are older than 40.  Are overweight.  Have wear-and-tear arthritis (osteoarthritis).  Have plantar fascia inflammation.  Participate in sports or activities that include a lot of running or jumping.  Wear poorly fitted shoes. What are the signs or symptoms? Some people have no symptoms. If you do have symptoms, they may include:  Pain in the bottom of your heel.  Pain that is worse when you first get out of bed.  Pain that gets worse after walking or standing. How is this diagnosed? This condition may be diagnosed based on:  Your symptoms and medical history.  A physical exam.  A foot X-ray. How is this treated? Treatment for this condition depends on how much pain you have. Treatment options may include:  Doing stretching exercises.  Losing weight, if necessary.  Wearing specific shoes or inserts inside of shoes (orthotics) for comfort and support.  Wearing splints on your feet while you sleep. Splints keep your feet in a position (usually 90 degrees) that should prevent and relieve the pain you feel when you first get out of bed. They also make stretching easier in the morning.  Taking over-the-counter medicine to relieve pain, such as NSAIDs.  Using high-intensity sound waves to break up the heel spur (extracorporeal  shock wave therapy).  Getting steroid injections in your heel to reduce inflammation.  Having surgery, if your heel spur causes long-term (chronic) pain. Follow these instructions at home:  Activity  Avoid activities that cause pain until you recover, or for as long as directed by your health care provider.  Do stretching exercises as directed. Stretch before exercising or being physically active. Managing pain, stiffness, and swelling  If directed, put ice on your foot: ? Put ice in a plastic bag. ? Place a towel between your skin and the bag. ? Leave the ice on for 20 minutes, 2-3 times a day.  Move your toes often to avoid stiffness and to lessen swelling.  When possible, raise (elevate) your foot above the level of your heart while you are sitting or lying down. General instructions  Take over-the-counter and prescription medicines only as told by your health care provider.  Wear supportive shoes that fit well. Wear splints, inserts, or orthotics as told by your health care provider.  If recommended, work with your health care provider to lose weight. This can relieve pressure on your foot.  Do not use any products that contain nicotine or tobacco, such as cigarettes and e-cigarettes. These can affect bone growth and healing. If you need help quitting, ask your health care provider.  Keep all follow-up visits as told by your health care provider. This is important. Contact a health care provider if:  Your pain does not go away with treatment.  Your pain gets  gets worse. Summary  A heel spur is a bony growth that forms on the bottom of the heel bone (calcaneus).  Heel spurs often cause inflammation in the band of tissue that connects the toes to the heel bone (plantar fascia). This may cause pain on the bottom of the foot, near the heel.  Doing stretching exercises, losing weight, wearing specific shoes or shoe inserts, wearing splints while you sleep, and  taking pain medicine may ease the pain and stiffness.  Other treatment options may include high-intensity sound waves to break up the heel spur, steroid injections, or surgery. This information is not intended to replace advice given to you by your health care provider. Make sure you discuss any questions you have with your health care provider. Document Revised: 01/14/2017 Document Reviewed: 01/14/2017 Elsevier Patient Education  2020 [employer]  

## 2019-09-06 ENCOUNTER — Ambulatory Visit: Payer: Medicare Other | Admitting: Orthopedic Surgery

## 2019-09-06 DIAGNOSIS — H26492 Other secondary cataract, left eye: Secondary | ICD-10-CM | POA: Diagnosis not present

## 2019-09-06 DIAGNOSIS — H43813 Vitreous degeneration, bilateral: Secondary | ICD-10-CM | POA: Diagnosis not present

## 2019-09-06 DIAGNOSIS — H524 Presbyopia: Secondary | ICD-10-CM | POA: Diagnosis not present

## 2019-09-06 DIAGNOSIS — H04123 Dry eye syndrome of bilateral lacrimal glands: Secondary | ICD-10-CM | POA: Diagnosis not present

## 2019-09-08 ENCOUNTER — Ambulatory Visit: Payer: Medicare Other | Admitting: Orthopedic Surgery

## 2019-09-08 ENCOUNTER — Encounter: Payer: Self-pay | Admitting: Orthopedic Surgery

## 2019-09-08 VITALS — Ht 64.0 in | Wt 240.6 lb

## 2019-09-08 DIAGNOSIS — M6702 Short Achilles tendon (acquired), left ankle: Secondary | ICD-10-CM | POA: Diagnosis not present

## 2019-09-08 DIAGNOSIS — M25572 Pain in left ankle and joints of left foot: Secondary | ICD-10-CM | POA: Diagnosis not present

## 2019-09-09 LAB — URIC ACID: Uric Acid, Serum: 7.5 mg/dL — ABNORMAL HIGH (ref 2.5–7.0)

## 2019-09-11 ENCOUNTER — Encounter: Payer: Self-pay | Admitting: Orthopedic Surgery

## 2019-09-11 NOTE — Progress Notes (Signed)
Office Visit Note   Patient: Virginia Parrish           Date of Birth: [birth year]           MRN: [id number] Visit Date: 09/08/2019              Requested by: Kennyth Arnold, [place],  [place] [id number] PCP: No primary care provider on file.  Chief Complaint  Patient presents with  . Left Ankle - Pain, New Patient (Initial Visit)  . Left Foot - Pain      HPI: Patient is a 74 year old woman who was seen for initial evaluation for left foot and ankle pain.  She states she does have a history of gout complains of pain on the top of her foot pain over the Achilles and pain on the plantar fascia.  She states she has been having symptoms for 3 weeks she states she has 2 bone spurs in her heel and has had a history of plantar fasciitis.  Patient states she has not been taking her gout medication.  Assessment & Plan: Visit Diagnoses:  1. Pain in left ankle and joints of left foot   2. Acquired contracture of Achilles tendon, left     Plan: We will draw a uric acid she is given instructions and demonstrated Achilles stretching also recommended a stiff soled shoe to further unload the plantar fascia.  We will contact the patient to ensure she resumes her gout medication and will need to follow-up in 3 months with repeat uric acid level.  Follow-Up Instructions: No follow-ups on file.   Ortho Exam  Patient is alert, oriented, no adenopathy, well-dressed, normal affect, normal respiratory effort. Examination patient has good pulses with her knee extended she has dorsiflexion only to neutral with Achilles contracture there are no palpable nodules on the Achilles the plantar fascia is tender over the origin of the plantar fascia.  She also has some tenderness to palpation across the forefoot.  Uric acid was drawn today and laboratory values are 7.5  Imaging: No results found. No images are attached to the encounter.  Labs: Lab Results  Component Value Date    LABURIC 7.5 (H) 09/08/2019     Lab Results  Component Value Date   ALBUMIN 4.3 03/03/2018   ALBUMIN 4.6 01/12/2017   LABURIC 7.5 (H) 09/08/2019    No results found for: MG No results found for: VD25OH  No results found for: PREALBUMIN CBC EXTENDED Latest Ref Rng & Units 03/03/2018 01/12/2017  WBC 4.0 - 10.5 K/uL 5.7 6.6  RBC 3.87 - 5.11 Mil/uL 5.03 4.88  HGB 12.0 - 15.0 g/dL 14.6 14.4  HCT 36 - 46 % 44.5 43.6  PLT 150 - 400 K/uL 250.0 264.0  NEUTROABS 1.4 - 7.7 K/uL 3.3 -  LYMPHSABS 0.7 - 4.0 K/uL 1.7 -     Body mass index is 41.3 kg/m.  Orders:  Orders Placed This Encounter  Procedures  . Uric acid   No orders of the defined types were placed in this encounter.    Procedures: No procedures performed  Clinical Data: No additional findings.  ROS:  All other systems negative, except as noted in the HPI. Review of Systems  Objective: Vital Signs: Ht 5\' 4"  (1.626 m)   Wt (!) 240 lb 9.6 oz (109.1 kg)   BMI 41.30 kg/m   Specialty Comments:  No specialty comments available.  PMFS History: Patient Active Problem List   Diagnosis Date  Noted  . Hypothyroidism 01/12/2017  . GERD (gastroesophageal reflux disease) 01/12/2017  . HTN (hypertension) 01/12/2017  . Gout 01/12/2017   Past Medical History:  Diagnosis Date  . Allergy   . Colon polyp    benign  . GERD (gastroesophageal reflux disease)   . History of chicken pox   . Hypertension     Family History  Problem Relation Age of Onset  . Arthritis Mother   . Hearing loss Mother   . Hyperlipidemia Mother   . Hypertension Mother   . Kidney disease Mother   . Arthritis Father   . Heart disease Father   . Drug abuse Maternal Grandmother   . Arthritis Maternal Grandfather   . Drug abuse Paternal Grandfather     Past Surgical History:  Procedure Laterality Date  . ABDOMINAL HYSTERECTOMY  2007   Complete due to cervical cancer  . CHOLECYSTECTOMY  2003  . EYE SURGERY Bilateral 05/12/2018    cataract sx   Social History   Occupational History  . Not on file  Tobacco Use  . Smoking status: Never Smoker  . Smokeless tobacco: Never Used  Vaping Use  . Vaping Use: Never used  Substance and Sexual Activity  . Alcohol use: Yes    Comment: wine with dinner  . Drug use: No  . Sexual activity: Yes    Partners: Male

## 2019-09-19 ENCOUNTER — Other Ambulatory Visit: Payer: Self-pay

## 2019-09-19 NOTE — Telephone Encounter (Signed)
She has an appt with Dr. Loletha Grayer on 09/21/2019. This can be addressed then

## 2019-09-19 NOTE — Telephone Encounter (Signed)
Last seen 08/29/19 Last filled 02/25/19 #[id number]  With 1 refill

## 2019-09-20 ENCOUNTER — Other Ambulatory Visit: Payer: Self-pay

## 2019-09-21 ENCOUNTER — Encounter: Payer: Self-pay | Admitting: Family Medicine

## 2019-09-21 ENCOUNTER — Ambulatory Visit (INDEPENDENT_AMBULATORY_CARE_PROVIDER_SITE_OTHER): Payer: Medicare Other | Admitting: Family Medicine

## 2019-09-21 VITALS — BP 124/82 | HR 57 | Temp 98.0°F | Ht 64.0 in | Wt 237.2 lb

## 2019-09-21 DIAGNOSIS — K219 Gastro-esophageal reflux disease without esophagitis: Secondary | ICD-10-CM

## 2019-09-21 DIAGNOSIS — R1310 Dysphagia, unspecified: Secondary | ICD-10-CM

## 2019-09-21 DIAGNOSIS — M109 Gout, unspecified: Secondary | ICD-10-CM | POA: Diagnosis not present

## 2019-09-21 MED ORDER — INDOMETHACIN 50 MG PO CAPS: 50.0000 mg | ORAL_CAPSULE | Freq: Three times a day (TID) | ORAL | 1 refills | Status: DC

## 2019-09-21 MED ORDER — SPIRONOLACTONE-HCTZ 25-25 MG PO TABS: 1.0000 | ORAL_TABLET | Freq: Every day | ORAL | 3 refills | Status: DC

## 2019-09-21 NOTE — Progress Notes (Signed)
Virginia Parrish is a 74 y.o. female  Chief Complaint  Patient presents with  . Establish Care    overdue for wellness exam; discuss increased GERD symptoms; L foot pain and swelling    HPI: [name] is a 74 y.o. female here for [facility] appt, previous PCP Dr. Deborra Medina. Pt is a retired Software engineer.   Pt complains of  1. Increased GERD symptoms x 6 mo. Symptoms worse mainly at night. She sleeps on her stomach and feels this exacerbates her reflux. She has started eating dinner earlier and this has helped improve her symptoms in the past 2 mo.  She feels at times when she swallows her water "it stays in her throat". No pain with swallowing, no issues with food getting stuck.  Pt takes protonix 40mg  daily. Wt Readings from Last 3 Encounters:  09/21/19 237 lb 3.2 oz (107.6 kg)  09/08/19 (!) 240 lb 9.6 oz (109.1 kg)  08/29/19 240 lb 9.6 oz (109.1 kg)   2. Lt foot pain, swelling x 08/2019. Saw Dutch Quint, NP and then Dr. Sharol Given.  Pt has a h/o gout, on allopurinol 200mg  daily. Prior to 08/2019, pt was not consistently taking allopurinol.  She is now more conscious of her diet, mainly pork.  She is still having some pain and swelling in Lt great toe.  Past Medical History:  Diagnosis Date  . Allergy   . Colon polyp    benign  . GERD (gastroesophageal reflux disease)   . History of chicken pox   . Hypertension     Past Surgical History:  Procedure Laterality Date  . ABDOMINAL HYSTERECTOMY  2007   Complete due to cervical cancer  . CHOLECYSTECTOMY  2003  . EYE SURGERY Bilateral 05/12/2018   cataract sx    Social History   Socioeconomic History  . Marital status: Married    Spouse name: Not on file  . Number of children: Not on file  . Years of education: Not on file  . Highest education level: Not on file  Occupational History  . Not on file  Tobacco Use  . Smoking status: Never Smoker  . Smokeless tobacco: Never Used  Vaping Use  . Vaping Use: Never used  Substance and  Sexual Activity  . Alcohol use: Yes    Comment: wine with dinner  . Drug use: No  . Sexual activity: Yes    Partners: Male  Other Topics Concern  . Not on file  Social History Narrative  . Not on file   Social Determinants of Health   Financial Resource Strain:   . Difficulty of Paying Living Expenses:   Food Insecurity:   . Worried About Charity fundraiser in the Last Year:   . Arboriculturist in the Last Year:   Transportation Needs:   . Film/video editor (Medical):   Marland Kitchen Lack of Transportation (Non-Medical):   Physical Activity:   . Days of Exercise per Week:   . Minutes of Exercise per Session:   Stress:   . Feeling of Stress :   Social Connections:   . Frequency of Communication with Friends and Family:   . Frequency of Social Gatherings with Friends and Family:   . Attends Religious Services:   . Active Member of Clubs or Organizations:   . Attends Archivist Meetings:   Marland Kitchen Marital Status:   Intimate Partner Violence:   . Fear of Current or Ex-Partner:   . Emotionally Abused:   .  Physically Abused:   . Sexually Abused:     Family History  Problem Relation Age of Onset  . Arthritis Mother   . Hearing loss Mother   . Hyperlipidemia Mother   . Hypertension Mother   . Kidney disease Mother   . Arthritis Father   . Heart disease Father   . Drug abuse Maternal Grandmother   . Arthritis Maternal Grandfather   . Drug abuse Paternal Grandfather      Immunization History  Administered Date(s) Administered  . Fluad Quad(high Dose 65+) 10/26/2018  . Influenza, High Dose Seasonal PF 10/28/2016, 10/16/2017  . Pneumococcal Conjugate-13 03/03/2018    Outpatient Encounter Medications as of 09/21/2019  Medication Sig  . allopurinol (ZYLOPRIM) 100 MG tablet Take 1 tablet (100 mg total) by mouth daily. (Patient taking differently: Take 200 mg by mouth daily. )  . amoxicillin (AMOXIL) 500 MG capsule Take 4 prior to dental procedure  . aspirin (ASPIR-LOW)  81 MG EC tablet Take 81 mg by mouth daily.   . Cholecalciferol (VITAMIN D-3) 5000 units TABS Take 1 tablet by mouth daily.  . clotrimazole-betamethasone (LOTRISONE) cream Apply 1 application topically as needed.  Marland Kitchen levothyroxine (SYNTHROID) 50 MCG tablet Take 1 tablet (50 mcg total) by mouth daily.  . pantoprazole (PROTONIX) 40 MG tablet TAKE ONE TABLET BY MOUTH DAILY  . spironolactone-hydrochlorothiazide (ALDACTAZIDE) 25-25 MG tablet Take 1 tablet by mouth daily.  . vitamin E 1000 UNIT capsule Take 1,000 Units by mouth daily.  . [DISCONTINUED] spironolactone-hydrochlorothiazide (ALDACTAZIDE) 25-25 MG tablet TAKE ONE TABLET BY MOUTH DAILY   No facility-administered encounter medications on file as of 09/21/2019.     ROS: Pertinent positives and negatives noted in HPI. Remainder of ROS non-contributory   No Known Allergies  BP 124/82 (BP Location: Left Arm, Patient Position: Sitting, Cuff Size: Large)   Pulse (!) 57   Temp 98 F (36.7 C) (Oral)   Ht 5\' 4"  (1.626 m)   Wt 237 lb 3.2 oz (107.6 kg)   SpO2 100%   BMI 40.72 kg/m   Physical Exam Vitals reviewed.  Constitutional:      General: She is not in acute distress.    Appearance: Normal appearance. She is not ill-appearing.  Abdominal:     General: Bowel sounds are normal. There is no distension.     Palpations: Abdomen is soft.     Tenderness: There is no abdominal tenderness.  Musculoskeletal:     Left foot: Swelling and tenderness (Lt great toe swollen, mildly erythematous, some TTP) present.  Neurological:     Mental Status: She is alert and oriented to person, place, and time.  Psychiatric:        Mood and Affect: Mood normal.        Behavior: Behavior normal.      A/P:  1. Gastroesophageal reflux disease, unspecified whether esophagitis present 2. Dysphagia, unspecified type - Ambulatory referral to Gastroenterology - cont on protonix 40mg  daily as pt has improved her night time GERD symptoms by eating dinner  earlier - pt feels like water and possibly food is "hanging" in her throat at times, not getting stuck  3. Gouty arthritis of left great toe Rx: - indomethacin (INDOCIN) 50 MG capsule; Take 1 capsule (50 mg total) by mouth 3 (three) times daily with meals.  Dispense: 90 capsule; Refill: 1 - Uric acid; Future - due in 2 mo - f/u in 2 wks if no/minimal improvement Discussed plan and reviewed medications with patient, including risks,  benefits, and potential side effects. Pt expressed understand. All questions answered.   This visit occurred during the SARS-CoV-2 public health emergency.  Safety protocols were in place, including screening questions prior to the visit, additional usage of staff PPE, and extensive cleaning of exam room while observing appropriate contact time as indicated for disinfecting solutions.

## 2019-10-05 ENCOUNTER — Encounter: Payer: Self-pay | Admitting: Gastroenterology

## 2019-10-12 NOTE — Progress Notes (Signed)
Subjective:   Virginia Parrish is a 74 y.o. female who presents for Medicare Annual (Subsequent) preventive examination.  I connected with Virginia Parrish today by telephone and verified that I am speaking with the correct person using two identifiers. Location patient: home Location provider: work Persons participating in the virtual visit: patient, Marine scientist.    I discussed the limitations, risks, security and privacy concerns of performing an evaluation and management service by telephone and the availability of in person appointments. I also discussed with the patient that there may be a patient responsible charge related to this service. The patient expressed understanding and verbally consented to this telephonic visit.    Interactive audio and video telecommunications were attempted between this provider and patient, however failed, due to patient having technical difficulties OR patient did not have access to video capability.  We continued and completed visit with audio only.  Some vital signs may be absent or patient reported.   Time Spent with patient on telephone encounter: 25 minutes  Review of Systems     Cardiac Risk Factors include: advanced age (>27men, >55 women);hypertension;obesity (BMI >30kg/m2);sedentary lifestyle     Objective:    Today's Vitals   10/13/19 1420  Weight: 237 lb (107.5 kg)  Height: 5\' 4"  (1.626 m)   Body mass index is 40.68 kg/m.  Advanced Directives 10/13/2019 10/04/2018 08/11/2018 05/20/2017  Does Patient Have a Medical Advance Directive? Yes No Yes No  Type of Paramedic of [place];Living will - - -  Does patient want to make changes to medical advance directive? - - Yes (MAU/Ambulatory/Procedural Areas - Information given) -  Copy of [place] in Chart? No - copy requested - - -  Would patient like information on creating a medical advance directive? - No - Patient declined - Yes (MAU/Ambulatory/Procedural Areas  - Information given)    Current Medications (verified) Outpatient Encounter Medications as of 10/13/2019  Medication Sig  . allopurinol (ZYLOPRIM) 100 MG tablet Take 1 tablet (100 mg total) by mouth daily. (Patient taking differently: Take 200 mg by mouth daily. )  . amoxicillin (AMOXIL) 500 MG capsule Take 4 prior to dental procedure  . aspirin (ASPIR-LOW) 81 MG EC tablet Take 81 mg by mouth daily.   . Cholecalciferol (VITAMIN D-3) 5000 units TABS Take 1 tablet by mouth daily.  . clotrimazole-betamethasone (LOTRISONE) cream Apply 1 application topically as needed.  . indomethacin (INDOCIN) 50 MG capsule Take 1 capsule (50 mg total) by mouth 3 (three) times daily with meals.  Marland Kitchen levothyroxine (SYNTHROID) 50 MCG tablet Take 1 tablet (50 mcg total) by mouth daily.  . pantoprazole (PROTONIX) 40 MG tablet TAKE ONE TABLET BY MOUTH DAILY  . spironolactone-hydrochlorothiazide (ALDACTAZIDE) 25-25 MG tablet Take 1 tablet by mouth daily.  . vitamin E 1000 UNIT capsule Take 1,000 Units by mouth daily.   No facility-administered encounter medications on file as of 10/13/2019.    Allergies (verified) Patient has no known allergies.   History: Past Medical History:  Diagnosis Date  . Allergy   . Colon polyp    benign  . GERD (gastroesophageal reflux disease)   . History of chicken pox   . Hypertension    Past Surgical History:  Procedure Laterality Date  . ABDOMINAL HYSTERECTOMY  2007   Complete due to cervical cancer  . CHOLECYSTECTOMY  2003  . EYE SURGERY Bilateral 05/12/2018   cataract sx   Family History  Problem Relation Age of Onset  . Arthritis Mother   .  Hearing loss Mother   . Hyperlipidemia Mother   . Hypertension Mother   . Kidney disease Mother   . Arthritis Father   . Heart disease Father   . Drug abuse Maternal Grandmother   . Arthritis Maternal Grandfather   . Drug abuse Paternal Grandfather    Social History   Socioeconomic History  . Marital status: Married     Spouse name: Not on file  . Number of children: Not on file  . Years of education: Not on file  . Highest education level: Not on file  Occupational History  . Occupation: Retired  Tobacco Use  . Smoking status: Never Smoker  . Smokeless tobacco: Never Used  Vaping Use  . Vaping Use: Never used  Substance and Sexual Activity  . Alcohol use: Yes    Comment: wine with dinner  . Drug use: No  . Sexual activity: Yes    Partners: Male  Other Topics Concern  . Not on file  Social History Narrative  . Not on file   Social Determinants of Health   Financial Resource Strain:   . Difficulty of Paying Living Expenses: Not on file  Food Insecurity:   . Worried About Charity fundraiser in the Last Year: Not on file  . Ran Out of Food in the Last Year: Not on file  Transportation Needs:   . Lack of Transportation (Medical): Not on file  . Lack of Transportation (Non-Medical): Not on file  Physical Activity:   . Days of Exercise per Week: Not on file  . Minutes of Exercise per Session: Not on file  Stress:   . Feeling of Stress : Not on file  Social Connections:   . Frequency of Communication with Friends and Family: Not on file  . Frequency of Social Gatherings with Friends and Family: Not on file  . Attends Religious Services: Not on file  . Active Member of Clubs or Organizations: Not on file  . Attends Archivist Meetings: Not on file  . Marital Status: Not on file    Tobacco Counseling Counseling given: Not Answered   Clinical Intake:  Pre-visit preparation completed: Yes  Pain : No/denies pain     Nutritional Status: BMI > 30  Obese Nutritional Risks: None Diabetes: No  How often do you need to have someone help you when you read instructions, pamphlets, or other written materials from your doctor or pharmacy?: 1 - Never What is the last grade level you completed in school?: Master's Degree  Diabetic?No  Interpreter Needed?: No  Information  entered by :: Caroleen Hamman LPN   Activities of Daily Living In your present state of health, do you have any difficulty performing the following activities: 10/13/2019  Hearing? N  Vision? N  Difficulty concentrating or making decisions? N  Walking or climbing stairs? N  Dressing or bathing? N  Doing errands, shopping? N  Preparing Food and eating ? N  Using the Toilet? N  In the past six months, have you accidently leaked urine? N  Do you have problems with loss of bowel control? N  Managing your Medications? N  Managing your Finances? N  Housekeeping or managing your Housekeeping? N  Some recent data might be hidden    Patient Care Team: Ronnald Nian, DO as PCP - General (Family Medicine)  Indicate any recent Medical Services you may have received from other than [facility] providers in the past year (date may be approximate).  Assessment:   This is a routine wellness examination for Cricket.  Hearing/Vision screen  Hearing Screening   125Hz  250Hz  500Hz  1000Hz  2000Hz  3000Hz  4000Hz  6000Hz  8000Hz   Right ear:           Left ear:           Comments: No issues  Vision Screening Comments: Wears glasses Cataracts removed Last eye exam-08/2019-Dr. Tanner  Dietary issues and exercise activities discussed: Current Exercise Habits: The patient does not participate in regular exercise at present, Exercise limited by: None identified  Goals    . DIET - INCREASE WATER INTAKE    . Weight (lb) < 200 lb (90.7 kg)      Depression Screen PHQ 2/9 Scores 10/13/2019 08/11/2018 03/03/2018 05/20/2017 01/12/2017  PHQ - 2 Score 0 0 0 0 0    Fall Risk Fall Risk  10/13/2019 08/11/2018 03/03/2018 05/20/2017 01/12/2017  Falls in the past year? 0 0 0 No No  Number falls in past yr: 0 - - - -  Injury with Fall? 0 - - - -  Follow up Falls prevention discussed - Falls evaluation completed - -    Any stairs in or around the home? No  Home free of loose throw rugs in walkways, pet beds, electrical  cords, etc? Yes  Adequate lighting in your home to reduce risk of falls? Yes   ASSISTIVE DEVICES UTILIZED TO PREVENT FALLS:  Life alert? No  Use of a cane, walker or w/c? No  Grab bars in the bathroom? Yes  Shower chair or bench in shower? No  Elevated toilet seat or a handicapped toilet? No   TIMED UP AND GO:  Was the test performed? No . Phone visit   Cognitive Function: No cognitive impairment noted.  MMSE - Mini Mental State Exam 05/20/2017  Orientation to time 5  Orientation to Place 5  Registration 3  Attention/ Calculation 5  Recall 3  Language- name 2 objects 2  Language- repeat 1  Language- follow 3 step command 3  Language- read & follow direction 1  Write a sentence 1  Copy design 1  Total score 30        Immunizations Immunization History  Administered Date(s) Administered  . Fluad Quad(high Dose 65+) 10/26/2018  . Influenza, High Dose Seasonal PF 10/28/2016, 10/16/2017  . Moderna SARS-COVID-2 Vaccination 03/14/2019, 04/11/2019  . Pneumococcal Conjugate-13 03/03/2018  . Zoster Recombinat (Shingrix) 10/09/2019    TDAP status: Due, Education has been provided regarding the importance of this vaccine. Advised may receive this vaccine at local pharmacy or Health Dept. Aware to provide a copy of the vaccination record if obtained from local pharmacy or Health Dept. Verbalized acceptance and understanding.   Fl vaccine status: Due-Phone visit. Patient to obtain in the office or local pharmacy.  Pneumococcal vaccine status: Up to date   Covid-19 vaccine status: Completed vaccines  Qualifies for Shingles Vaccine? No   Zostavax completed No   Shingrix Completed?: Yes First dose completed 10/09/19  Screening Tests Health Maintenance  Topic Date Due  . Hepatitis C Screening  Never done  . TETANUS/TDAP  Never done  . PNA vac Low Risk Adult (2 of 2 - PPSV23) 03/04/2019  . INFLUENZA VACCINE  09/11/2019  . Fecal DNA (Cologuard)  08/13/2020  . MAMMOGRAM   08/18/2020  . DEXA SCAN  Completed  . COVID-19 Vaccine  Completed    Health Maintenance  Health Maintenance Due  Topic Date Due  . Hepatitis C Screening  Never done  . TETANUS/TDAP  Never done  . PNA vac Low Risk Adult (2 of 2 - PPSV23) 03/04/2019  . INFLUENZA VACCINE  09/11/2019    Colorectal cancer screening: Completed Cologuard 08/13/2017. Repeat every 3 years   Mammogram Status: Ordered today. Paient aware that someone will be calling to schedule.  Bone Density status: Completed 08/19/2018. Results reflect: Bone density results: OSTEOPENIA. Repeat every 2 years.  Lung Cancer Screening: (Low Dose CT Chest recommended if Age 28-80 years, 30 pack-year currently smoking OR have quit w/in 15years.) does not qualify.     Additional Screening:  Hepatitis C Screening: does qualify; Discuss with PCP  Vision Screening: Recommended annual ophthalmology exams for early detection of glaucoma and other disorders of the eye. Is the patient up to date with their annual eye exam?  Yes  Who is the provider or what is the name of the office in which the patient attends annual eye exams? Dr.Tanner  Dental Screening: Recommended annual dental exams for proper oral hygiene  Community Resource Referral / Chronic Care Management: CRR required this visit?  No   CCM required this visit?  No      Plan:     I have personally reviewed and noted the following in the patient's chart:   . Medical and social history . Use of alcohol, tobacco or illicit drugs  . Current medications and supplements . Functional ability and status . Nutritional status . Physical activity . Advanced directives . List of other physicians . Hospitalizations, surgeries, and ER visits in previous 12 months . Vitals . Screenings to include cognitive, depression, and falls . Referrals and appointments  In addition, I have reviewed and discussed with patient certain preventive protocols, quality metrics, and best  practice recommendations. A written personalized care plan for preventive services as well as general preventive health recommendations were provided to patient.    .Due to this being a telephonic visit, the after visit summary with patients personalized plan was offered to patient via mail or my-chart.Patient would like to access on my-chart.  Marta Antu, LPN   08/14/7183  Nurse Health Advisor  Nurse Notes: None

## 2019-10-13 ENCOUNTER — Ambulatory Visit (INDEPENDENT_AMBULATORY_CARE_PROVIDER_SITE_OTHER): Payer: Medicare Other

## 2019-10-13 VITALS — Ht 64.0 in | Wt 237.0 lb

## 2019-10-13 DIAGNOSIS — Z0000 Encounter for general adult medical examination without abnormal findings: Secondary | ICD-10-CM | POA: Diagnosis not present

## 2019-10-13 DIAGNOSIS — Z1231 Encounter for screening mammogram for malignant neoplasm of breast: Secondary | ICD-10-CM | POA: Diagnosis not present

## 2019-10-13 NOTE — Patient Instructions (Signed)
Ms. Virginia Parrish , Thank you for taking time to complete your Medicare Wellness Visit. I appreciate your ongoing commitment to your health goals. Please review the following plan we discussed and let me know if I can assist you in the future.   Screening recommendations/referrals: Colonoscopy: Completed Cologuard 08/13/2017- Due 08/13/2020 Mammogram: Ordered today. Someone will be calling you to schedule Bone Density: Completed 08/19/2018- Due 08/18/2020 Recommended yearly ophthalmology/optometry visit for glaucoma screening and checkup Recommended yearly dental visit for hygiene and checkup  Vaccinations: Influenza vaccine: Due- You may obtain vaccine in the office or at your local pharmacy. Pneumococcal vaccine:Due for Pneumovax-23 - Discuss with PCP at next office visit. Tdap vaccine: Discuss with pharmacy Shingles vaccine: Completed first dose of vaccine.   Covid-19:Completed vaccines  Advanced directives: Please bring a copy for your records.  Conditions/risks identified: See problem list  Next appointment: Follow up in one year for your annual wellness visit 10/18/20 @ 11:15am.   Preventive Care 65 Years and Older, Female Preventive care refers to lifestyle choices and visits with your health care provider that can promote health and wellness. What does preventive care include?  A yearly physical exam. This is also called an annual well check.  Dental exams once or twice a year.  Routine eye exams. Ask your health care provider how often you should have your eyes checked.  Personal lifestyle choices, including:  Daily care of your teeth and gums.  Regular physical activity.  Eating a healthy diet.  Avoiding tobacco and drug use.  Limiting alcohol use.  Practicing safe sex.  Taking low-dose aspirin every day.  Taking vitamin and mineral supplements as recommended by your health care provider. What happens during an annual well check? The services and screenings done by your  health care provider during your annual well check will depend on your age, overall health, lifestyle risk factors, and family history of disease. Counseling  Your health care provider may ask you questions about your:  Alcohol use.  Tobacco use.  Drug use.  Emotional well-being.  Home and relationship well-being.  Sexual activity.  Eating habits.  History of falls.  Memory and ability to understand (cognition).  Work and work Statistician.  Reproductive health. Screening  You may have the following tests or measurements:  Height, weight, and BMI.  Blood pressure.  Lipid and cholesterol levels. These may be checked every 5 years, or more frequently if you are over 46 years old.  Skin check.  Lung cancer screening. You may have this screening every year starting at age 35 if you have a 30-pack-year history of smoking and currently smoke or have quit within the past 15 years.  Fecal occult blood test (FOBT) of the stool. You may have this test every year starting at age 62.  Flexible sigmoidoscopy or colonoscopy. You may have a sigmoidoscopy every 5 years or a colonoscopy every 10 years starting at age 1.  Hepatitis C blood test.  Hepatitis B blood test.  Sexually transmitted disease (STD) testing.  Diabetes screening. This is done by checking your blood sugar (glucose) after you have not eaten for a while (fasting). You may have this done every 1-3 years.  Bone density scan. This is done to screen for osteoporosis. You may have this done starting at age 84.  Mammogram. This may be done every 1-2 years. Talk to your health care provider about how often you should have regular mammograms. Talk with your health care provider about your test results, treatment options,  and if necessary, the need for more tests. Vaccines  Your health care provider may recommend certain vaccines, such as:  Influenza vaccine. This is recommended every year.  Tetanus, diphtheria, and  acellular pertussis (Tdap, Td) vaccine. You may need a Td booster every 10 years.  Zoster vaccine. You may need this after age 41.  Pneumococcal 13-valent conjugate (PCV13) vaccine. One dose is recommended after age 19.  Pneumococcal polysaccharide (PPSV23) vaccine. One dose is recommended after age 21. Talk to your health care provider about which screenings and vaccines you need and how often you need them. This information is not intended to replace advice given to you by your health care provider. Make sure you discuss any questions you have with your health care provider. Document Released: 02/23/2015 Document Revised: 10/17/2015 Document Reviewed: 11/28/2014 Elsevier Interactive Patient Education  2017 [place] Prevention in the Home Falls can cause injuries. They can happen to people of all ages. There are many things you can do to make your home safe and to help prevent falls. What can I do on the outside of my home?  Regularly fix the edges of walkways and driveways and fix any cracks.  Remove anything that might make you trip as you walk through a door, such as a raised step or threshold.  Trim any bushes or trees on the path to your home.  Use bright outdoor lighting.  Clear any walking paths of anything that might make someone trip, such as rocks or tools.  Regularly check to see if handrails are loose or broken. Make sure that both sides of any steps have handrails.  Any raised decks and porches should have guardrails on the edges.  Have any leaves, snow, or ice cleared regularly.  Use sand or salt on walking paths during winter.  Clean up any spills in your garage right away. This includes oil or grease spills. What can I do in the bathroom?  Use night lights.  Install grab bars by the toilet and in the tub and shower. Do not use towel bars as grab bars.  Use non-skid mats or decals in the tub or shower.  If you need to sit down in the shower, use  a plastic, non-slip stool.  Keep the floor dry. Clean up any water that spills on the floor as soon as it happens.  Remove soap buildup in the tub or shower regularly.  Attach bath mats securely with double-sided non-slip rug tape.  Do not have throw rugs and other things on the floor that can make you trip. What can I do in the bedroom?  Use night lights.  Make sure that you have a light by your bed that is easy to reach.  Do not use any sheets or blankets that are too big for your bed. They should not hang down onto the floor.  Have a firm chair that has side arms. You can use this for support while you get dressed.  Do not have throw rugs and other things on the floor that can make you trip. What can I do in the kitchen?  Clean up any spills right away.  Avoid walking on wet floors.  Keep items that you use a lot in easy-to-reach places.  If you need to reach something above you, use a strong step stool that has a grab bar.  Keep electrical cords out of the way.  Do not use floor polish or wax that makes floors slippery. If  you must use wax, use non-skid floor wax.  Do not have throw rugs and other things on the floor that can make you trip. What can I do with my stairs?  Do not leave any items on the stairs.  Make sure that there are handrails on both sides of the stairs and use them. Fix handrails that are broken or loose. Make sure that handrails are as long as the stairways.  Check any carpeting to make sure that it is firmly attached to the stairs. Fix any carpet that is loose or worn.  Avoid having throw rugs at the top or bottom of the stairs. If you do have throw rugs, attach them to the floor with carpet tape.  Make sure that you have a light switch at the top of the stairs and the bottom of the stairs. If you do not have them, ask someone to add them for you. What else can I do to help prevent falls?  Wear shoes that:  Do not have high heels.  Have  rubber bottoms.  Are comfortable and fit you well.  Are closed at the toe. Do not wear sandals.  If you use a stepladder:  Make sure that it is fully opened. Do not climb a closed stepladder.  Make sure that both sides of the stepladder are locked into place.  Ask someone to hold it for you, if possible.  Clearly mark and make sure that you can see:  Any grab bars or handrails.  First and last steps.  Where the edge of each step is.  Use tools that help you move around (mobility aids) if they are needed. These include:  Canes.  Walkers.  Scooters.  Crutches.  Turn on the lights when you go into a dark area. Replace any light bulbs as soon as they burn out.  Set up your furniture so you have a clear path. Avoid moving your furniture around.  If any of your floors are uneven, fix them.  If there are any pets around you, be aware of where they are.  Review your medicines with your doctor. Some medicines can make you feel dizzy. This can increase your chance of falling. Ask your doctor what other things that you can do to help prevent falls. This information is not intended to replace advice given to you by your health care provider. Make sure you discuss any questions you have with your health care provider. Document Released: 11/23/2008 Document Revised: 07/05/2015 Document Reviewed: 03/03/2014 Elsevier Interactive Patient Education  2017 [employer].

## 2019-10-20 ENCOUNTER — Encounter: Payer: Self-pay | Admitting: Family Medicine

## 2019-10-20 DIAGNOSIS — M109 Gout, unspecified: Secondary | ICD-10-CM

## 2019-10-20 MED ORDER — ALLOPURINOL 300 MG PO TABS: 300.0000 mg | ORAL_TABLET | Freq: Every day | ORAL | 3 refills | Status: DC

## 2019-10-31 ENCOUNTER — Other Ambulatory Visit: Payer: Self-pay

## 2019-10-31 ENCOUNTER — Ambulatory Visit (HOSPITAL_BASED_OUTPATIENT_CLINIC_OR_DEPARTMENT_OTHER)
Admission: RE | Admit: 2019-10-31 | Discharge: 2019-10-31 | Disposition: A | Discharge status: Home / Self Care | Payer: Medicare Other | Source: Ambulatory Visit | Attending: Family Medicine | Admitting: Family Medicine

## 2019-10-31 DIAGNOSIS — Z1231 Encounter for screening mammogram for malignant neoplasm of breast: Secondary | ICD-10-CM | POA: Insufficient documentation

## 2019-10-31 IMAGING — MG DIGITAL SCREENING BILAT W/ TOMO W/ CAD
6 of 12 series · 6 of 36 positions shown · non-contrast
Comparison: Previous exam(s).

ACR Breast Density Category a: The breast tissue is almost entirely
fatty.

CLINICAL DATA: Screening.

EXAM:
DIGITAL SCREENING BILATERAL MAMMOGRAM WITH TOMO AND CAD

[R MLO synth-2D]
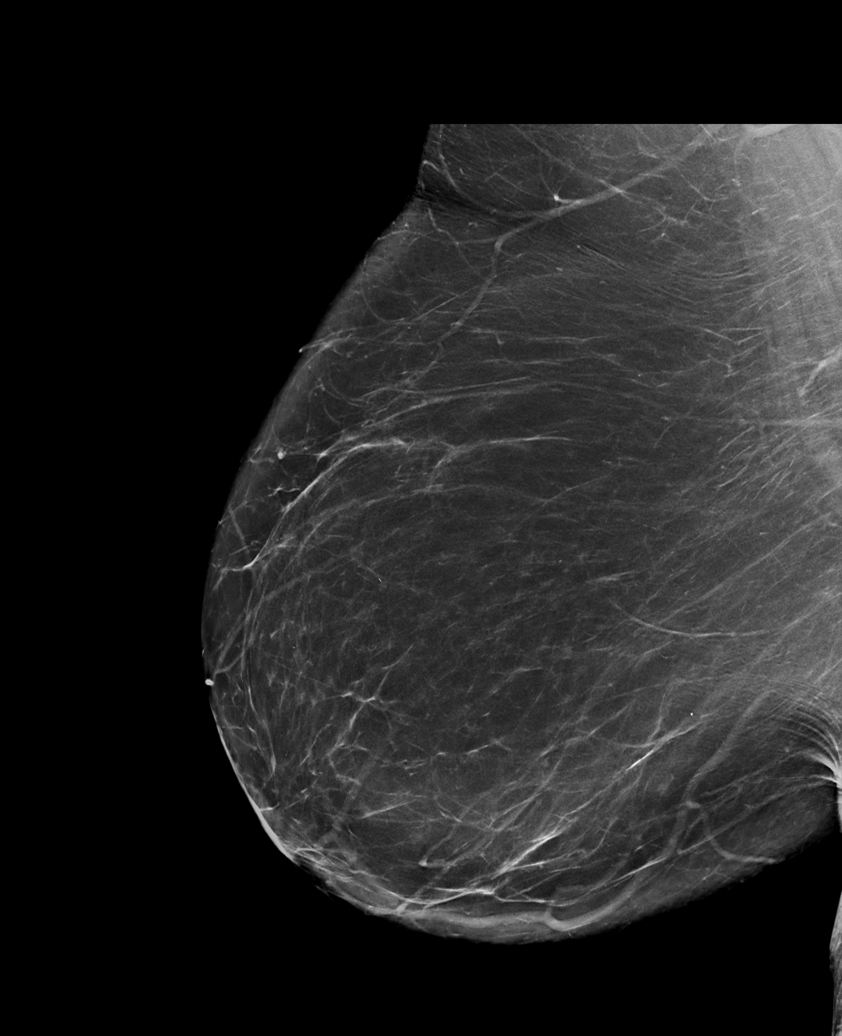

[L MLO synth-2D (1 of 2)]
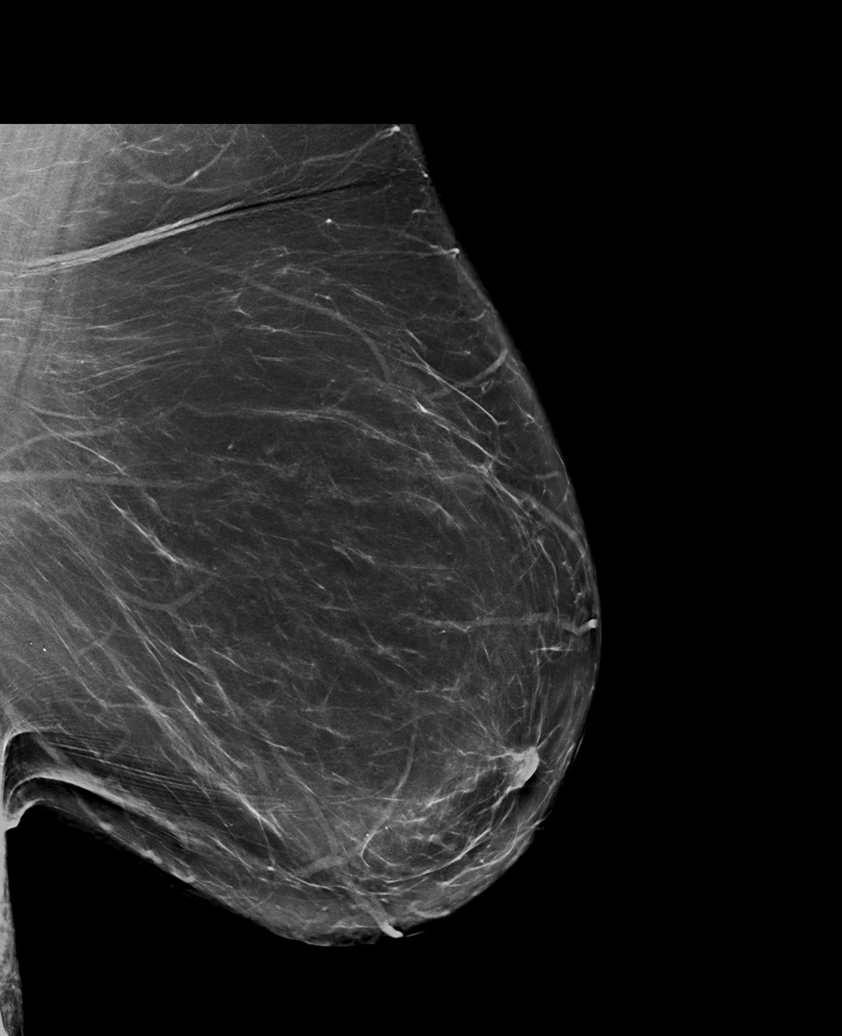

[R CC synth-2D]
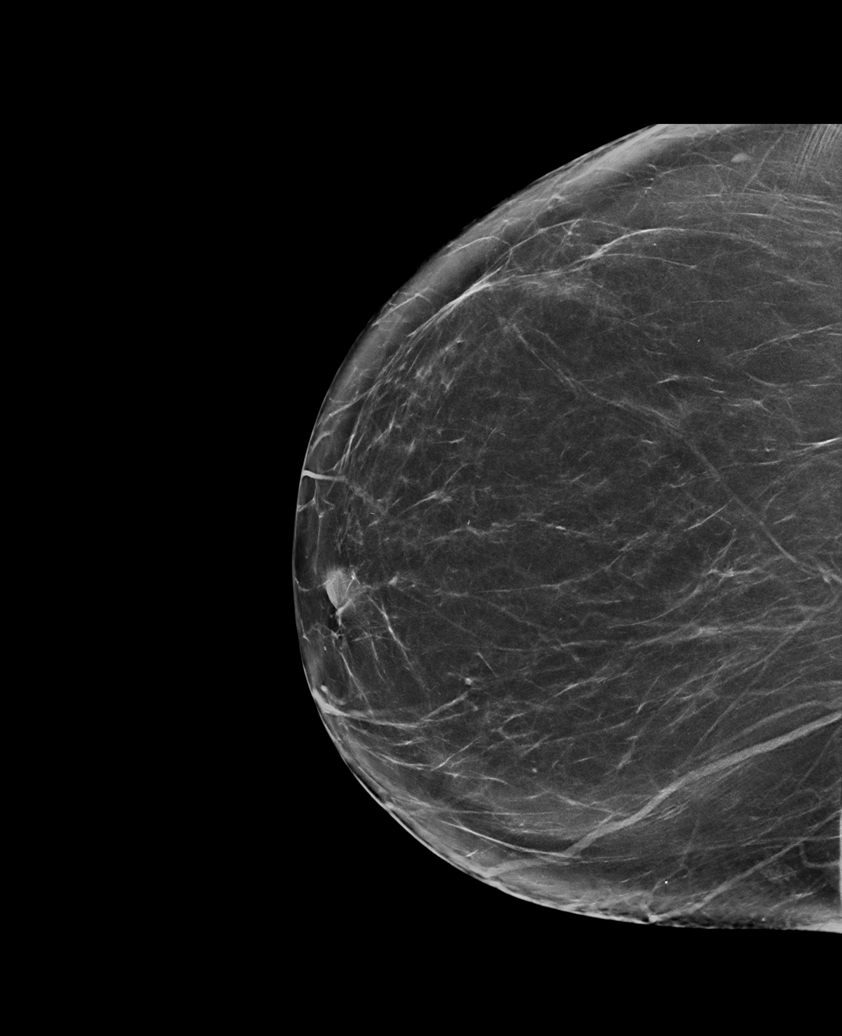

[R XCCM synth-2D]
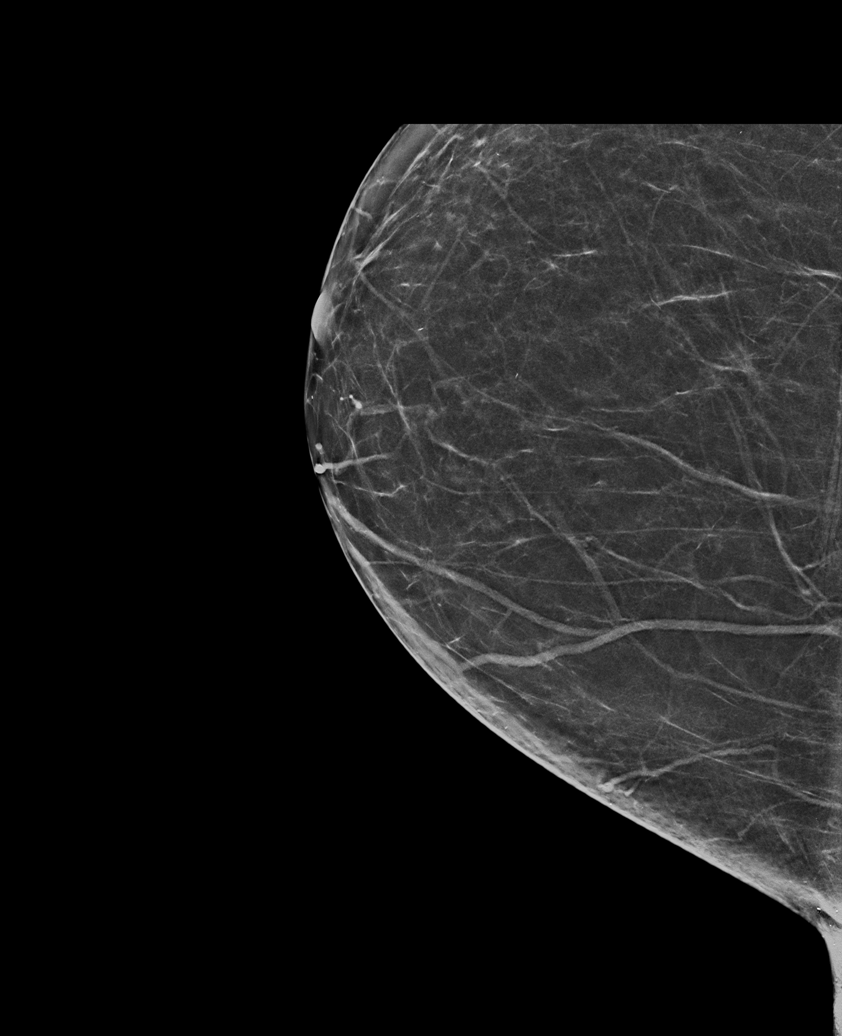

[L CC synth-2D]
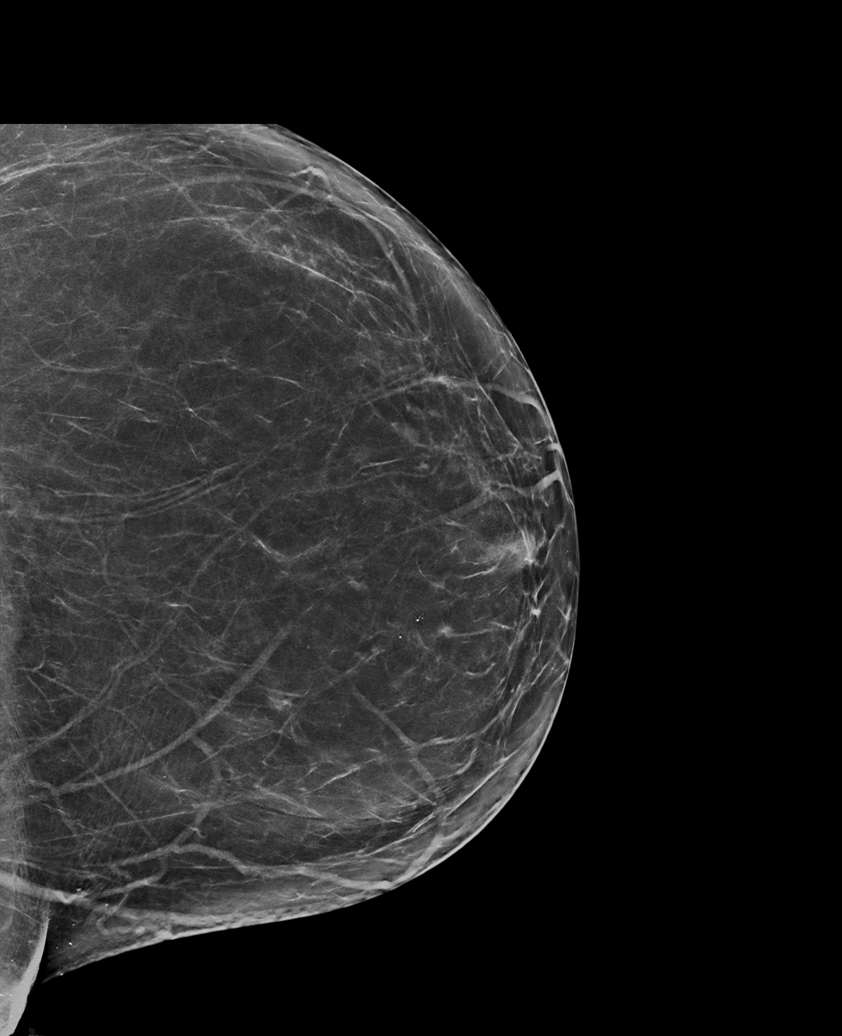

[L MLO synth-2D (2 of 2)]
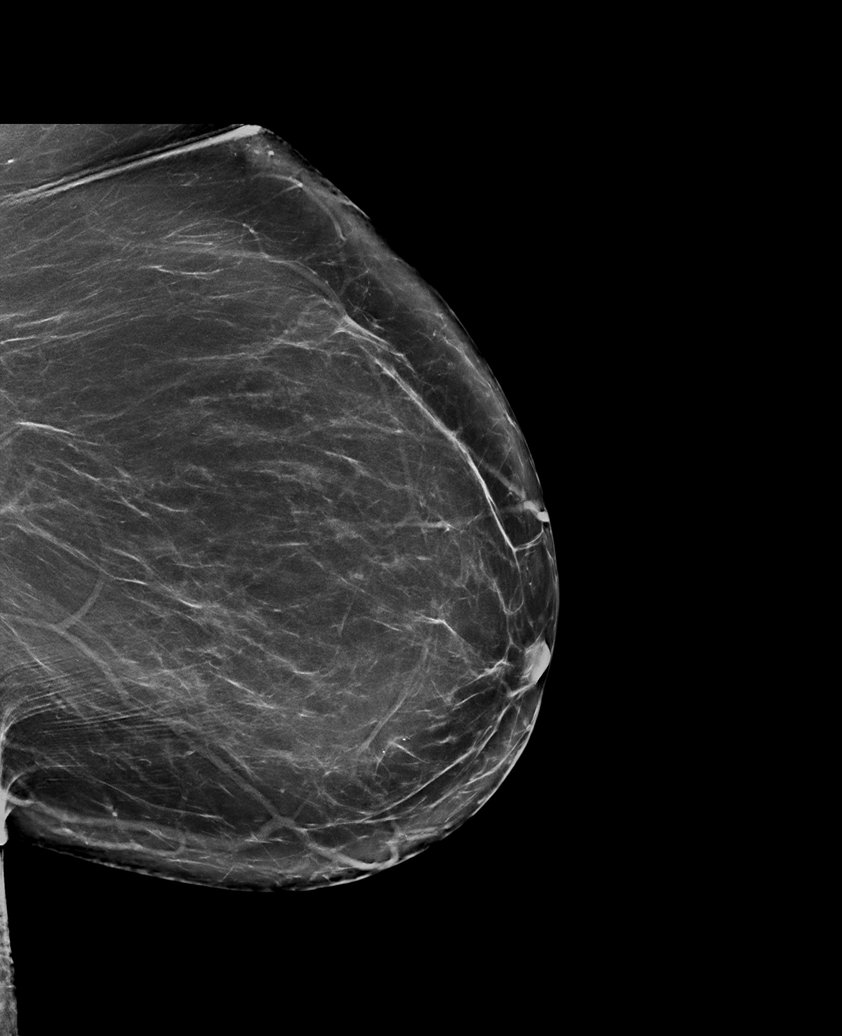

[6 of 36 positions shown; findings below may reference images not displayed]

FINDINGS: There are no findings suspicious for malignancy. Images were
processed with CAD.
IMPRESSION: No mammographic evidence of malignancy. A result letter of this
screening mammogram will be mailed directly to the patient.

RECOMMENDATION:
Screening mammogram in one year. (Code:[TA])

BI-RADS CATEGORY  1: Negative.

## 2019-11-15 ENCOUNTER — Other Ambulatory Visit: Payer: Self-pay | Admitting: Family Medicine

## 2019-11-18 ENCOUNTER — Ambulatory Visit: Payer: Medicare Other | Admitting: Gastroenterology

## 2019-11-18 ENCOUNTER — Encounter: Payer: Self-pay | Admitting: Gastroenterology

## 2019-11-18 VITALS — BP 124/86 | HR 62 | Ht 64.0 in | Wt 237.2 lb

## 2019-11-18 DIAGNOSIS — Z1211 Encounter for screening for malignant neoplasm of colon: Secondary | ICD-10-CM | POA: Diagnosis not present

## 2019-11-18 DIAGNOSIS — R1310 Dysphagia, unspecified: Secondary | ICD-10-CM

## 2019-11-18 DIAGNOSIS — K219 Gastro-esophageal reflux disease without esophagitis: Secondary | ICD-10-CM

## 2019-11-18 DIAGNOSIS — Z1212 Encounter for screening for malignant neoplasm of rectum: Secondary | ICD-10-CM | POA: Diagnosis not present

## 2019-11-18 NOTE — Progress Notes (Signed)
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  She has had reflux for many years, but particularly worse over the last  6-7 months, w/ +nocturnal symptoms.  Significant nocturnal event 6 months ago with regurgitation and choking sensation that woke her from sleep.  Had subsequent cough for the next 3 days that eventually resolved. Symptoms worse when she sleeps on her stomach.  Has tried to avoid eating within 3 hours of bedtime with some improvement and has tried sleeping on side with improvement.  Does endorse dysphagia with water (particularly cold) over last 3-4 months, but not necessarily with solids.  Seems to do ok with carbonated beverages. No odynophagia.  Takes Protonix 40 mg/day for years.  Very rare daytime symptoms.  Thinks she had an EGD years ago as pre-operative w/u for abdominal hernia repair ~2015.   Last colonoscopy was ~2011 in [place] and normal per patient. ColoGuard negative in 2016.   -03/03/2018: Normal CBC and CMP  FHx of reflux (mother, son), but o/w no CRC, IBD, GI malignancy.    Past Medical History:  Diagnosis Date  . Allergy   . Arthritis   . Colon polyp    benign  . GERD (gastroesophageal reflux disease)   . Gout   . History of chicken pox   . Hypertension   . Thyroid disease      Past Surgical History:  Procedure Laterality Date  . ABDOMINAL HYSTERECTOMY  2007   Complete due to cervical cancer  . CHOLECYSTECTOMY  2003  . COLONOSCOPY     around 2011 in [place]  . EYE SURGERY Bilateral 05/12/2018   cataract sx  . HERNIA REPAIR  2014-2015   abdominal hernia with mesh   Family History  Problem Relation Age of Onset  . Arthritis Mother   . Hearing loss Mother   .  Hyperlipidemia Mother   . Hypertension Mother   . Kidney disease Mother   . Arthritis Father   . Heart disease Father   . Drug abuse Maternal Grandmother   . Arthritis Maternal Grandfather   . Drug abuse Paternal Grandfather   . Esophageal cancer Neg Hx   . Colon cancer Neg Hx    Social History   Tobacco Use  . Smoking status: Former Research scientist (life sciences)  . Smokeless tobacco: Never Used  . Tobacco comment: only smoked in high school and college  Vaping Use  . Vaping Use: Never used  Substance Use Topics  . Alcohol use: Yes    Comment: wine with dinner  . Drug use: No   Current Outpatient Medications  Medication Sig Dispense Refill  . allopurinol (ZYLOPRIM) 300 MG tablet Take 1 tablet (300 mg total) by mouth daily. 90 tablet 3  . aspirin (ASPIR-LOW) 81 MG EC tablet Take 81 mg by mouth daily.     . Calcium Carbonate Antacid (CALCIUM CARBONATE PO) Take by mouth.    . Cholecalciferol (VITAMIN D-3) 5000 units TABS Take 1 tablet by mouth daily.    . clotrimazole-betamethasone (LOTRISONE) cream Apply 1 application topically as needed.    . indomethacin (INDOCIN) 50 MG capsule Take 1 capsule (50 mg total) by mouth 3 (three) times  daily with meals. (Patient taking differently: Take 50 mg by mouth as needed. ) 90 capsule 1  . levothyroxine (SYNTHROID) 50 MCG tablet TAKE ONE TABLET BY MOUTH DAILY 90 tablet 0  . pantoprazole (PROTONIX) 40 MG tablet TAKE ONE TABLET BY MOUTH DAILY 90 tablet 3  . spironolactone-hydrochlorothiazide (ALDACTAZIDE) 25-25 MG tablet Take 1 tablet by mouth daily. 90 tablet 3  . vitamin E 1000 UNIT capsule Take 1,000 Units by mouth daily.    Marland Kitchen amoxicillin (AMOXIL) 500 MG capsule Take 4 prior to dental procedure (Patient not taking: Reported on 11/18/2019) 4 capsule 0   No current facility-administered medications for this visit.   No Known Allergies   Review of Systems: All systems reviewed and negative except where noted in HPI.     Physical Exam:    Wt Readings from  Last 3 Encounters:  11/18/19 237 lb 4 oz (107.6 kg)  10/13/19 237 lb (107.5 kg)  09/21/19 237 lb 3.2 oz (107.6 kg)    Ht 5\' 4"  (1.626 m)   Wt 237 lb 4 oz (107.6 kg)   BMI 40.72 kg/m  Constitutional:  Pleasant, in no acute distress. Psychiatric: Normal mood and affect. Behavior is normal. EENT: Pupils normal.  Conjunctivae are normal. No scleral icterus. Neck supple. No cervical LAD. Cardiovascular: Normal rate, regular rhythm. No edema Pulmonary/chest: Effort normal and breath sounds normal. No wheezing, rales or rhonchi. Abdominal: Soft, nondistended, nontender. Bowel sounds active throughout. There are no masses palpable. No hepatomegaly. Neurological: Alert and oriented to person place and time. Skin: Skin is warm and dry. No rashes noted.   ASSESSMENT AND PLAN;   1) GERD 2) Dysphagia -EGD to evaluate for erosive esophagitis, hiatal hernia, LES laxity -Plan for esophageal dilation as appropriate at time of EGD -Unsure if her lack of daytime symptoms is due to Protonix working well but waning at night, or if she just has predominant nocturnal reflux.  Will trial changing Protonix to 30-60 minutes prior to dinner and evaluate for clinical effect.  If effective for nighttime symptoms, but breakthrough daytime, may require BID dosing -Resume antireflux lifestyle/dietary modifications -Pending EGD findings and response to change in therapy, if ongoing dysphagia, could consider Esophageal capnometry  3) Colon cancer screening -Due for age-appropriate, ongoing CRC screening -Schedule colonoscopy  The indications, risks, and benefits of EGD and colonoscopy were explained to the patient in detail. Risks include but are not limited to bleeding, perforation, adverse reaction to medications, and cardiopulmonary compromise. Sequelae include but are not limited to the possibility of surgery, hositalization, and mortality. The patient verbalized understanding and wished to proceed. All  questions answered, referred to scheduler and bowel prep ordered. Further recommendations pending results of the exam.      Lavena Bullion, DO, FACG  11/18/2019, 9:32 AM   Ronnald Nian, DO

## 2019-11-18 NOTE — Addendum Note (Signed)
Addended by: Curlene Labrum E on: 11/18/2019 10:34 AM   Modules accepted: Orders

## 2019-11-18 NOTE — Patient Instructions (Signed)
If you are age 72 or older, your body mass index should be between 23-30. Your Body mass index is 40.72 kg/m. If this is out of the aforementioned range listed, please consider follow up with your Primary Care Provider.  If you are age 9 or younger, your body mass index should be between 19-25. Your Body mass index is 40.72 kg/m. If this is out of the aformentioned range listed, please consider follow up with your Primary Care Provider.   You have been scheduled for an endoscopy and colonoscopy. Please follow the written instructions given to you at your visit today. Please pick up your prep supplies at the pharmacy within the next 1-3 days. If you use inhalers (even only as needed), please bring them with you on the day of your procedure.  You have been given a Clenpiq sample.  It was a pleasure to see you today!  Vito Cirigliano, D.O.

## 2019-12-01 ENCOUNTER — Encounter: Payer: Self-pay | Admitting: Gastroenterology

## 2019-12-07 ENCOUNTER — Other Ambulatory Visit: Payer: Self-pay

## 2019-12-08 ENCOUNTER — Encounter: Payer: Self-pay | Admitting: Family Medicine

## 2019-12-08 ENCOUNTER — Ambulatory Visit (INDEPENDENT_AMBULATORY_CARE_PROVIDER_SITE_OTHER): Payer: Medicare Other | Admitting: Family Medicine

## 2019-12-08 VITALS — BP 132/80 | HR 55 | Temp 97.4°F | Ht 64.0 in | Wt 239.8 lb

## 2019-12-08 DIAGNOSIS — Z1321 Encounter for screening for nutritional disorder: Secondary | ICD-10-CM

## 2019-12-08 DIAGNOSIS — M1A9XX1 Chronic gout, unspecified, with tophus (tophi): Secondary | ICD-10-CM

## 2019-12-08 DIAGNOSIS — Z0000 Encounter for general adult medical examination without abnormal findings: Secondary | ICD-10-CM

## 2019-12-08 DIAGNOSIS — I10 Essential (primary) hypertension: Secondary | ICD-10-CM | POA: Diagnosis not present

## 2019-12-08 DIAGNOSIS — Z13220 Encounter for screening for lipoid disorders: Secondary | ICD-10-CM

## 2019-12-08 DIAGNOSIS — Z23 Encounter for immunization: Secondary | ICD-10-CM

## 2019-12-08 DIAGNOSIS — K219 Gastro-esophageal reflux disease without esophagitis: Secondary | ICD-10-CM

## 2019-12-08 DIAGNOSIS — E039 Hypothyroidism, unspecified: Secondary | ICD-10-CM

## 2019-12-08 DIAGNOSIS — M8580 Other specified disorders of bone density and structure, unspecified site: Secondary | ICD-10-CM

## 2019-12-08 DIAGNOSIS — Z6841 Body Mass Index (BMI) 40.0 and over, adult: Secondary | ICD-10-CM

## 2019-12-08 MED ORDER — PANTOPRAZOLE SODIUM 40 MG PO TBEC: 40.0000 mg | DELAYED_RELEASE_TABLET | Freq: Every day | ORAL | 3 refills | Status: DC

## 2019-12-08 NOTE — Progress Notes (Signed)
Virginia Parrish is a 74 y.o. female  Chief Complaint  Patient presents with  . Annual Exam    CPE/labs. refill on Pantoprazole.  not fasting this am. tdap >10 yrs, pneumo ?    HPI: Virginia Parrish is a 74 y.o. female seen today for annual exam, labs, and f/u on chronic medical issues including HTN, gout, hypothyroidism.  Pt is getting covid booster tomorrow. She is due for pneumonia vaccine and will get today.   Last mammo: 10/2019 - normal Last Dexa: 10/2018 - osteopenia w/ T-score = -1.4 Last colonoscopy: scheduled for 12/15/19 with Dr. Gerrit Heck ([facility])  Dental: UTD - goes every 6 mo Vision: wears glasses, UTD on exam  Med refills needed today? See orders   Past Medical History:  Diagnosis Date  . Allergy   . Arthritis   . Colon polyp    benign  . Gallstones   . GERD (gastroesophageal reflux disease)   . Gout   . History of chicken pox   . Hypertension   . Thyroid disease     Past Surgical History:  Procedure Laterality Date  . ABDOMINAL HYSTERECTOMY  2007   Complete due to cervical cancer  . CHOLECYSTECTOMY  2003  . COLONOSCOPY     around 2011 in [place] around 2016  . EYE SURGERY Bilateral 05/12/2018   cataract sx  . HERNIA REPAIR  2014-2015   abdominal hernia with mesh    Social History   Socioeconomic History  . Marital status: Married    Spouse name: Not on file  . Number of children: Not on file  . Years of education: Not on file  . Highest education level: Not on file  Occupational History  . Occupation: Retired  Tobacco Use  . Smoking status: Former Research scientist (life sciences)  . Smokeless tobacco: Never Used  . Tobacco comment: only smoked in high school and college  Vaping Use  . Vaping Use: Never used  Substance and Sexual Activity  . Alcohol use: Yes    Comment: wine with dinner  . Drug use: No  . Sexual activity: Yes    Partners: Male  Other Topics Concern  . Not on file  Social History Narrative  . Not on file   Social  Determinants of Health   Financial Resource Strain:   . Difficulty of Paying Living Expenses: Not on file  Food Insecurity:   . Worried About Charity fundraiser in the Last Year: Not on file  . Ran Out of Food in the Last Year: Not on file  Transportation Needs:   . Lack of Transportation (Medical): Not on file  . Lack of Transportation (Non-Medical): Not on file  Physical Activity:   . Days of Exercise per Week: Not on file  . Minutes of Exercise per Session: Not on file  Stress:   . Feeling of Stress : Not on file  Social Connections:   . Frequency of Communication with Friends and Family: Not on file  . Frequency of Social Gatherings with Friends and Family: Not on file  . Attends Religious Services: Not on file  . Active Member of Clubs or Organizations: Not on file  . Attends Archivist Meetings: Not on file  . Marital Status: Not on file  Intimate Partner Violence:   . Fear of Current or Ex-Partner: Not on file  . Emotionally Abused: Not on file  . Physically Abused: Not on file  . Sexually Abused: Not on file  Family History  Problem Relation Age of Onset  . Arthritis Mother   . Hearing loss Mother   . Hyperlipidemia Mother   . Hypertension Mother   . Kidney disease Mother   . Arthritis Father   . Heart disease Father   . Drug abuse Maternal Grandmother   . Arthritis Maternal Grandfather   . Drug abuse Paternal Grandfather   . Esophageal cancer Neg Hx   . Colon cancer Neg Hx      Immunization History  Administered Date(s) Administered  . Fluad Quad(high Dose 65+) 10/26/2018  . Influenza, High Dose Seasonal PF 10/28/2016, 10/16/2017  . Influenza-Unspecified 11/09/2019  . Moderna SARS-COVID-2 Vaccination 03/14/2019, 04/11/2019  . Pneumococcal Conjugate-13 03/03/2018  . Zoster Recombinat (Shingrix) 10/09/2019    Outpatient Encounter Medications as of 12/08/2019  Medication Sig  . allopurinol (ZYLOPRIM) 300 MG tablet Take 1 tablet (300 mg  total) by mouth daily.  Marland Kitchen aspirin (ASPIR-LOW) 81 MG EC tablet Take 81 mg by mouth daily.   Marland Kitchen CALCIUM PO Take by mouth daily.  . Cholecalciferol (VITAMIN D-3) 5000 units TABS Take 1 tablet by mouth daily.  . clotrimazole-betamethasone (LOTRISONE) cream Apply 1 application topically as needed.  . indomethacin (INDOCIN) 50 MG capsule Take 1 capsule (50 mg total) by mouth 3 (three) times daily with meals. (Patient taking differently: Take 50 mg by mouth as needed. )  . levothyroxine (SYNTHROID) 50 MCG tablet TAKE ONE TABLET BY MOUTH DAILY  . pantoprazole (PROTONIX) 40 MG tablet Take 1 tablet (40 mg total) by mouth daily.  Marland Kitchen spironolactone-hydrochlorothiazide (ALDACTAZIDE) 25-25 MG tablet Take 1 tablet by mouth daily.  . vitamin E 1000 UNIT capsule Take 1,000 Units by mouth daily.  . [DISCONTINUED] pantoprazole (PROTONIX) 40 MG tablet TAKE ONE TABLET BY MOUTH DAILY  . [DISCONTINUED] amoxicillin (AMOXIL) 500 MG capsule Take 4 prior to dental procedure (Patient not taking: Reported on 11/18/2019)   No facility-administered encounter medications on file as of 12/08/2019.     ROS: Gen: no fever, chills  Skin: no rash, itching ENT: no ear pain, ear drainage, nasal congestion, rhinorrhea, sinus pressure, sore throat Eyes: no blurry vision, double vision Resp: no cough, wheeze,SOB CV: no CP, palpitations, LE edema,  GI: no heartburn, n/v/d/c, abd pain GU: no dysuria, urgency, frequency, hematuria MSK: no joint pain, myalgias, back pain Neuro: no dizziness, headache, weakness Psych: no depression, anxiety, insomnia   No Known Allergies  BP 132/80   Pulse (!) 55   Temp (!) 97.4 F (36.3 C) (Temporal)   Ht 5\' 4"  (1.626 m)   Wt 239 lb 12.8 oz (108.8 kg)   SpO2 99%   BMI 41.16 kg/m    BP Readings from Last 3 Encounters:  12/08/19 132/80  11/18/19 124/86  09/21/19 124/82   Pulse Readings from Last 3 Encounters:  12/08/19 (!) 55  11/18/19 62  09/21/19 (!) 57   Wt Readings from Last 3  Encounters:  12/08/19 239 lb 12.8 oz (108.8 kg)  11/18/19 237 lb 4 oz (107.6 kg)  10/13/19 237 lb (107.5 kg)     Physical Exam Constitutional:      General: She is not in acute distress.    Appearance: She is well-developed. She is obese.  HENT:     Head: Normocephalic and atraumatic.  Neck:     Thyroid: No thyroid mass, thyromegaly or thyroid tenderness.  Cardiovascular:     Rate and Rhythm: Normal rate and regular rhythm.     Heart sounds: Normal heart  sounds. No murmur heard.   Pulmonary:     Effort: Pulmonary effort is normal. No respiratory distress.     Breath sounds: Normal breath sounds. No wheezing or rhonchi.  Abdominal:     General: Bowel sounds are normal.     Palpations: Abdomen is soft.  Musculoskeletal:     Cervical back: Neck supple.     Right lower leg: Edema (1+ pedal edema) present.     Left lower leg: Edema (1+ pedal edema) present.  Lymphadenopathy:     Cervical: No cervical adenopathy.  Skin:    General: Skin is warm and dry.  Neurological:     Mental Status: She is alert and oriented to person, place, and time.     Motor: No abnormal muscle tone.     Coordination: Coordination normal.  Psychiatric:        Mood and Affect: Mood normal.        Behavior: Behavior normal.     A/P:  1. Annual physical exam - discussed importance of regular CV exercise, healthy diet, adequate sleep - UTD on dental and vision - UTD on mammo, dexa; has colo scheduled in 12/2019  2. Hypothyroidism, unspecified type - on levothyroxine 85mcg daily - TSH; Future - T4, free; Future  3. Primary hypertension - controlled, at goal - cont aldactazide 25-25mg  daily - Comprehensive metabolic panel; Future - CBC; Future  4. 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 Screening for lipid disorders  6. Encounter for vitamin deficiency screening -  VITAMIN D 25 Hydroxy (Vit-D Deficiency, Fractures); Future - Lipid panel; Future  7. Osteopenia, unspecified location - cont Vit D 5000IU daily - VITAMIN D 25 Hydroxy (Vit-D Deficiency, Fractures); Future  8. Need for pneumococcal vaccine - Pneumococcal polysaccharide vaccine 23-valent greater than or equal to 2yo subcutaneous/IM  9. Gastroesophageal reflux disease, unspecified whether esophagitis present - has established with GI and has EGD and colonoscopy scheduled  Refill: - pantoprazole (PROTONIX) 40 MG tablet; Take 1 tablet (40 mg total) by mouth daily.  Dispense: 90 tablet; Refill: 3  10. Class 3 severe obesity without serious comorbidity with body mass index (BMI) of 40.0 to 44.9 in adult, unspecified obesity type ([place]) - s/p B/L TKA and pt is not able to be as active as needed to lose weight - low carb, low fat diet - consider referral to [facility]  This visit occurred during the SARS-CoV-2 public health emergency.  Safety protocols were in place, including screening questions prior to the visit, additional usage of staff PPE, and extensive cleaning of exam room while observing appropriate contact time as indicated for disinfecting solutions.

## 2019-12-08 NOTE — Patient Instructions (Addendum)
Vaccines at local pharmacy: Tdap - tetanus and pertussis (whooping cough) Shingrix (shingles) #2 - you had 1 dose 10/09/19 but need second/final dose

## 2019-12-15 ENCOUNTER — Ambulatory Visit (AMBULATORY_SURGERY_CENTER): Payer: Medicare Other | Admitting: Gastroenterology

## 2019-12-15 ENCOUNTER — Other Ambulatory Visit: Payer: Self-pay

## 2019-12-15 ENCOUNTER — Encounter: Payer: Self-pay | Admitting: Gastroenterology

## 2019-12-15 VITALS — BP 113/50 | HR 57 | Temp 97.1°F | Resp 15 | Ht 64.0 in | Wt 237.0 lb

## 2019-12-15 DIAGNOSIS — Z860100 Personal history of colon polyps, unspecified: Secondary | ICD-10-CM

## 2019-12-15 DIAGNOSIS — R1310 Dysphagia, unspecified: Secondary | ICD-10-CM

## 2019-12-15 DIAGNOSIS — K317 Polyp of stomach and duodenum: Secondary | ICD-10-CM | POA: Diagnosis not present

## 2019-12-15 DIAGNOSIS — K449 Diaphragmatic hernia without obstruction or gangrene: Secondary | ICD-10-CM

## 2019-12-15 DIAGNOSIS — D122 Benign neoplasm of ascending colon: Secondary | ICD-10-CM

## 2019-12-15 DIAGNOSIS — K2100 Gastro-esophageal reflux disease with esophagitis, without bleeding: Secondary | ICD-10-CM

## 2019-12-15 DIAGNOSIS — K5730 Diverticulosis of large intestine without perforation or abscess without bleeding: Secondary | ICD-10-CM

## 2019-12-15 DIAGNOSIS — K635 Polyp of colon: Secondary | ICD-10-CM

## 2019-12-15 DIAGNOSIS — K641 Second degree hemorrhoids: Secondary | ICD-10-CM

## 2019-12-15 DIAGNOSIS — D125 Benign neoplasm of sigmoid colon: Secondary | ICD-10-CM

## 2019-12-15 MED ORDER — SODIUM CHLORIDE 0.9 % IV SOLN: 500.0000 mL | INTRAVENOUS | Status: DC

## 2019-12-15 NOTE — Progress Notes (Signed)
Lidocaine 100mg IV given to blunt gag reflex 

## 2019-12-15 NOTE — Patient Instructions (Signed)
YOU HAD AN ENDOSCOPIC PROCEDURE TODAY AT THE [facility]:   Refer to the procedure report that was given to you for any specific questions about what was found during the examination.  If the procedure report does not answer your questions, please call your gastroenterologist to clarify.  If you requested that your care partner not be given the details of your procedure findings, then the procedure report has been included in a sealed envelope for you to review at your convenience later.  YOU SHOULD EXPECT: Some feelings of bloating in the abdomen. Passage of more gas than usual.  Walking can help get rid of the air that was put into your GI tract during the procedure and reduce the bloating. If you had a lower endoscopy (such as a colonoscopy or flexible sigmoidoscopy) you may notice spotting of blood in your stool or on the toilet paper. If you underwent a bowel prep for your procedure, you may not have a normal bowel movement for a few days.  Please Note:  You might notice some irritation and congestion in your nose or some drainage.  This is from the oxygen used during your procedure.  There is no need for concern and it should clear up in a day or so.  SYMPTOMS TO REPORT IMMEDIATELY:   Following lower endoscopy (colonoscopy or flexible sigmoidoscopy):  Excessive amounts of blood in the stool  Significant tenderness or worsening of abdominal pains  Swelling of the abdomen that is new, acute  Fever of 100F or higher   Following upper endoscopy (EGD)  Vomiting of blood or coffee ground material  New chest pain or pain under the shoulder blades  Painful or persistently difficult swallowing  New shortness of breath  Fever of 100F or higher  Black, tarry-looking stools  For urgent or emergent issues, a gastroenterologist can be reached at any hour by calling [phone]. Do not use MyChart messaging for urgent concerns.    DIET:  We do recommend a small meal at first, but  then you may proceed to your regular diet.  Drink plenty of fluids but you should avoid alcoholic beverages for 24 hours.  ACTIVITY:  You should plan to take it easy for the rest of today and you should NOT DRIVE or use heavy machinery until tomorrow (because of the sedation medicines used during the test).    FOLLOW UP: Our staff will call the number listed on your records 48-72 hours following your procedure to check on you and address any questions or concerns that you may have regarding the information given to you following your procedure. If we do not reach you, we will leave a message.  We will attempt to reach you two times.  During this call, we will ask if you have developed any symptoms of COVID 19. If you develop any symptoms (ie: fever, flu-like symptoms, shortness of breath, cough etc.) before then, please call [phone].  If you test positive for Covid 19 in the 2 weeks post procedure, please call and report this information to us.    If any biopsies were taken you will be contacted by phone or by letter within the next 1-3 weeks.  Please call us at [phone] if you have not heard about the biopsies in 3 weeks.    SIGNATURES/CONFIDENTIALITY: You and/or your care partner have signed paperwork which will be entered into your electronic medical record.  These signatures attest to the fact that that the information above on   your After Visit Summary has been reviewed and is understood.  Full responsibility of the confidentiality of this discharge information lies with you and/or your care-partner. 

## 2019-12-15 NOTE — Op Note (Signed)
[place] Patient Name: Virginia Parrish Procedure Date: 12/15/2019 1:25 PM MRN: [id number] Endoscopist: Gerrit Heck , MD Age: 74 Referring MD:  Date of Birth: 03-06-1945 Gender: Female Account #: [id number] Procedure:                Upper GI endoscopy Indications:              Dysphagia, Suspected esophageal reflux Medicines:                Monitored Anesthesia Care Procedure:                Pre-Anesthesia Assessment:                           - Prior to the procedure, a History and Physical                            was performed, and patient medications and                            allergies were reviewed. The patient's tolerance of                            previous anesthesia was also reviewed. The risks                            and benefits of the procedure and the sedation                            options and risks were discussed with the patient.                            All questions were answered, and informed consent                            was obtained. Prior Anticoagulants: The patient has                            taken no previous anticoagulant or antiplatelet                            agents. ASA Grade Assessment: II - A patient with                            mild systemic disease. After reviewing the risks                            and benefits, the patient was deemed in                            satisfactory condition to undergo the procedure.                           After obtaining informed consent, the endoscope was  passed under direct vision. Throughout the                            procedure, the patient's blood pressure, pulse, and                            oxygen saturations were monitored continuously. The                            Endoscope was introduced through the mouth, and                            advanced to the second part of duodenum. The upper                            GI endoscopy  was accomplished without difficulty.                            The patient tolerated the procedure well. Scope In: Scope Out: Findings:                 LA Grade B (one or more mucosal breaks greater than                            5 mm, not extending between the tops of two mucosal                            folds) esophagitis with no bleeding was found in                            the lower third of the esophagus.                           A 3 cm hiatal hernia was present.                           The upper third of the esophagus and middle third                            of the esophagus were normal. The scope was                            withdrawn. Dilation was performed with a Maloney                            dilator with mild resistance at 60 Fr. The dilation                            site was examined following endoscope reinsertion                            and showed mild mucosal disruption at 16 cm,  consistent with successful dilation in the proximal                            esophagus. Estimated blood loss was minimal.                           A few small sessile polyps with no bleeding and no                            stigmata of recent bleeding were found in the                            gastric fundus and in the gastric body. Several of                            these polyps were removed with a cold biopsy                            forceps for histologic representative evaluation.                            Resection and retrieval were complete. Estimated                            blood loss was minimal.                           The examined duodenum was normal. Complications:            No immediate complications. Estimated Blood Loss:     Estimated blood loss was minimal. 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                           - 3 cm hiatal hernia.                           - Normal  upper third of esophagus and middle third                            of esophagus. Dilated.                           - A few gastric polyps. Resected and retrieved.                           - Normal examined duodenum. Recommendation:           - Patient has a contact number available for                            emergencies. The signs and symptoms of potential  delayed complications were discussed with the                            patient. Return to normal activities tomorrow.                            Written discharge instructions were provided to the                            patient.                           - Soft diet today then increase back to previous                            diet tomorrow.                           - Continue present medications.                           - Await pathology results.                           - Increase Protonix (pantoprazole) to 40 mg PO BID                            for 6 weeks to promote mucosal healing of the                            esophagitis, then reduce back to 40 mg daily for                            continued control of reflux.                           - Return to GI clinic at appointment to be                            scheduled. Gerrit Heck, MD 12/15/2019 2:23:10 PM

## 2019-12-15 NOTE — Progress Notes (Signed)
Vs CW ° °

## 2019-12-15 NOTE — Progress Notes (Signed)
Called to room to assist during endoscopic procedure.  Patient ID and intended procedure confirmed with present staff. Received instructions for my participation in the procedure from the performing physician.  

## 2019-12-15 NOTE — Op Note (Signed)
[place] Patient Name: Virginia Parrish Procedure Date: 12/15/2019 1:25 PM MRN: [id number] Endoscopist: Gerrit Heck , MD Age: 74 Referring MD:  Date of Birth: 06-10-1945 Gender: Female Account #: [id number] Procedure:                Colonoscopy Indications:              Screening for colorectal malignant neoplasm (last                            colonoscopy was 10 years ago) Medicines:                Monitored Anesthesia Care Procedure:                Pre-Anesthesia Assessment:                           - Prior to the procedure, a History and Physical                            was performed, and patient medications and                            allergies were reviewed. The patient's tolerance of                            previous anesthesia was also reviewed. The risks                            and benefits of the procedure and the sedation                            options and risks were discussed with the patient.                            All questions were answered, and informed consent                            was obtained. Prior Anticoagulants: The patient has                            taken no previous anticoagulant or antiplatelet                            agents. ASA Grade Assessment: II - A patient with                            mild systemic disease. After reviewing the risks                            and benefits, the patient was deemed in                            satisfactory condition to undergo the procedure.  After obtaining informed consent, the colonoscope                            was passed under direct vision. Throughout the                            procedure, the patient's blood pressure, pulse, and                            oxygen saturations were monitored continuously. The                            Colonoscope was introduced through the anus and                            advanced to the the cecum,  identified by                            appendiceal orifice and ileocecal valve. The                            colonoscopy was technically difficult and complex                            due to significant looping. Successful completion                            of the procedure was aided by using manual                            pressure. The patient tolerated the procedure well.                            The quality of the bowel preparation was fair. The                            ileocecal valve, appendiceal orifice, and rectum                            were photographed. Scope In: 1:45:36 PM Scope Out: 2:11:11 PM Scope Withdrawal Time: 0 hours 22 minutes 33 seconds  Total Procedure Duration: 0 hours 25 minutes 35 seconds  Findings:                 Hemorrhoids were found on perianal exam.                           A 8 mm polyp was found in the ascending colon. The                            polyp was sessile. The polyp was removed with a                            cold snare. Resection and retrieval were complete.  Estimated blood loss was minimal.                           Three sessile polyps were found in the sigmoid                            colon. The polyps were 2 to 5 mm in size. 1 had                            adherent mucus cap. These polyps were removed with                            a cold snare. Resection and retrieval were                            complete. Estimated blood loss was minimal.                            Estimated blood loss was minimal.                           Multiple small and large-mouthed diverticula were                            found in the sigmoid colon.                           Non-bleeding internal hemorrhoids were found during                            retroflexion. The hemorrhoids were small and Grade                            II (internal hemorrhoids that prolapse but reduce                             spontaneously).                           The ascending colon revealed moderately excessive                            looping. Advancing the scope required using manual                            pressure.                           A moderate amount of semi-liquid stool was found in                            the entire colon. Lavage of the area was performed                            using copious amounts  of tap water, resulting in                            clearance with fair and improved visualization. Complications:            No immediate complications. Estimated Blood Loss:     Estimated blood loss was minimal. Estimated blood                            loss was minimal. Impression:               - Preparation of the colon was fair. This was                            improve with copious irrigation.                           - Hemorrhoids found on perianal exam.                           - One 8 mm polyp in the ascending colon, removed                            with a cold snare. Resected and retrieved.                           - Three 2 to 5 mm polyps in the sigmoid colon,                            removed with a cold snare. Resected and retrieved.                           - Diverticulosis in the sigmoid colon.                           - Non-bleeding internal hemorrhoids.                           - There was significant looping of the colon.                           - Stool in the entire examined colon. Recommendation:           - Patient has a contact number available for                            emergencies. The signs and symptoms of potential                            delayed complications were discussed with the                            patient. Return to normal activities tomorrow.  Written discharge instructions were provided to the                            patient.                           - Continue present medications.                            - Await pathology results.                           - Repeat colonoscopy in 5 years for surveillance.                           - Return to GI clinic at appointment to be                            scheduled.                           - Internal hemorrhoids were noted on this study and                            may be amenable to hemorrhoid band ligation. If you                            are interested in further treatment of these                            hemorrhoids with band ligation, please contact my                            clinic to set up an appointment for evaluation and                            treatment. Gerrit Heck, MD 12/15/2019 2:28:32 PM

## 2019-12-15 NOTE — Progress Notes (Signed)
Pt Drowsy. VSS. To PACU, report to RN. No anesthetic complications noted.  

## 2019-12-19 ENCOUNTER — Other Ambulatory Visit: Payer: Self-pay

## 2019-12-19 ENCOUNTER — Telehealth: Payer: Self-pay | Admitting: *Deleted

## 2019-12-19 NOTE — Telephone Encounter (Signed)
°  Follow up Call-  Call back number 12/15/2019  Post procedure Call Back phone  # [phone]  Permission to leave phone message Yes  Some recent data might be hidden     Patient questions:  Do you have a fever, pain , or abdominal swelling? No. Pain Score  0 *  Have you tolerated food without any problems? Yes.    Have you been able to return to your normal activities? Yes.    Do you have any questions about your discharge instructions: Diet   No. Medications  No. Follow up visit  No.  Do you have questions or concerns about your Care? No.  Actions: * If pain score is 4 or above: No action needed, pain <4.  1. Have you developed a fever since your procedure? no  2.   Have you had an respiratory symptoms (SOB or cough) since your procedure? no  3.   Have you tested positive for COVID 19 since your procedure no  4.   Have you had any family members/close contacts diagnosed with the COVID 19 since your procedure?  no   If yes to any of these questions please route to Joylene John, RN and Joella Prince, RN

## 2019-12-20 ENCOUNTER — Other Ambulatory Visit (INDEPENDENT_AMBULATORY_CARE_PROVIDER_SITE_OTHER): Payer: Medicare Other

## 2019-12-20 DIAGNOSIS — I10 Essential (primary) hypertension: Secondary | ICD-10-CM | POA: Diagnosis not present

## 2019-12-20 DIAGNOSIS — M8580 Other specified disorders of bone density and structure, unspecified site: Secondary | ICD-10-CM

## 2019-12-20 DIAGNOSIS — M109 Gout, unspecified: Secondary | ICD-10-CM

## 2019-12-20 DIAGNOSIS — Z1321 Encounter for screening for nutritional disorder: Secondary | ICD-10-CM | POA: Diagnosis not present

## 2019-12-20 DIAGNOSIS — M1A9XX1 Chronic gout, unspecified, with tophus (tophi): Secondary | ICD-10-CM | POA: Diagnosis not present

## 2019-12-20 DIAGNOSIS — E039 Hypothyroidism, unspecified: Secondary | ICD-10-CM | POA: Diagnosis not present

## 2019-12-20 LAB — COMPREHENSIVE METABOLIC PANEL
ALT: 10 U/L (ref 0–35)
AST: 14 U/L (ref 0–37)
Albumin: 4.2 g/dL (ref 3.5–5.2)
Alkaline Phosphatase: 68 U/L (ref 39–117)
BUN: 19 mg/dL (ref 6–23)
CO2: 32 mEq/L (ref 19–32)
Calcium: 9.5 mg/dL (ref 8.4–10.5)
Chloride: 100 mEq/L (ref 96–112)
Creatinine, Ser: 0.82 mg/dL (ref 0.40–1.20)
GFR: 71.49 mL/min (ref >60.00)
Glucose, Bld: 104 mg/dL — ABNORMAL HIGH (ref 70–99)
Potassium: 4.8 mEq/L (ref 3.5–5.1)
Sodium: 141 mEq/L (ref 135–145)
Total Bilirubin: 0.5 mg/dL (ref 0.2–1.2)
Total Protein: 6.3 g/dL (ref 6.0–8.3)

## 2019-12-20 LAB — LIPID PANEL
Cholesterol: 181 mg/dL (ref 0–200)
HDL: 57.3 mg/dL (ref >39.00)
LDL Cholesterol: 101 mg/dL — ABNORMAL HIGH (ref 0–99)
NonHDL: 123.56
Total CHOL/HDL Ratio: 3
Triglycerides: 111 mg/dL (ref 0.0–149.0)
VLDL: 22.2 mg/dL (ref 0.0–40.0)

## 2019-12-20 LAB — VITAMIN D 25 HYDROXY (VIT D DEFICIENCY, FRACTURES): VITD: 50.9 ng/mL (ref 30.00–100.00)

## 2019-12-20 LAB — URIC ACID: Uric Acid, Serum: 4.9 mg/dL (ref 2.4–7.0)

## 2019-12-20 LAB — CBC
HCT: 41.7 % (ref 36.0–46.0)
Hemoglobin: 13.9 g/dL (ref 12.0–15.0)
MCHC: 33.4 g/dL (ref 30.0–36.0)
MCV: 88.4 fl (ref 78.0–100.0)
Platelets: 287 10*3/uL (ref 150.0–400.0)
RBC: 4.72 Mil/uL (ref 3.87–5.11)
RDW: 14.7 % (ref 11.5–15.5)
WBC: 6.5 10*3/uL (ref 4.0–10.5)

## 2019-12-20 LAB — TSH: TSH: 2.77 u[IU]/mL (ref 0.35–4.50)

## 2019-12-20 LAB — T4, FREE: Free T4: 1.06 ng/dL (ref 0.60–1.60)

## 2019-12-22 ENCOUNTER — Encounter: Payer: Self-pay | Admitting: Family Medicine

## 2019-12-22 ENCOUNTER — Encounter: Payer: Self-pay | Admitting: Gastroenterology

## 2020-01-15 ENCOUNTER — Encounter: Payer: Self-pay | Admitting: Family Medicine

## 2020-01-19 ENCOUNTER — Telehealth: Payer: Self-pay | Admitting: General Surgery

## 2020-01-19 MED ORDER — PANTOPRAZOLE SODIUM 40 MG PO TBEC: 40.0000 mg | DELAYED_RELEASE_TABLET | Freq: Two times a day (BID) | ORAL | 0 refills | Status: DC

## 2020-01-19 NOTE — Telephone Encounter (Signed)
Sent rx to pharmacy for protonix bid for 6 weeks per Dr Bryan Lemma

## 2020-01-19 NOTE — Telephone Encounter (Signed)
Will route message to Dr. Gerrit Heck for review and to send if appropriate

## 2020-01-19 NOTE — Telephone Encounter (Signed)
Patient calling to ask if she could get a Rx change for her Protonix.  Pt is currently taking 1 tab, QD but she is asking for it to be changed to 1 tab, BID, for 6 weeks, due to insurance.

## 2020-02-20 ENCOUNTER — Other Ambulatory Visit: Payer: Self-pay | Admitting: Family Medicine

## 2020-05-23 ENCOUNTER — Other Ambulatory Visit: Payer: Self-pay | Admitting: Family Medicine

## 2020-08-22 ENCOUNTER — Other Ambulatory Visit: Payer: Self-pay | Admitting: Family Medicine

## 2020-08-22 NOTE — Telephone Encounter (Signed)
Chart supports Rx Last seen 12/07/20 Next OV 10/16/20

## 2020-09-10 ENCOUNTER — Other Ambulatory Visit: Payer: Self-pay | Admitting: Family Medicine

## 2020-09-13 ENCOUNTER — Other Ambulatory Visit: Payer: Self-pay | Admitting: Family Medicine

## 2020-09-13 DIAGNOSIS — M109 Gout, unspecified: Secondary | ICD-10-CM

## 2020-10-16 ENCOUNTER — Ambulatory Visit: Payer: Medicare Other

## 2020-10-18 ENCOUNTER — Ambulatory Visit: Payer: Medicare Other

## 2020-10-23 ENCOUNTER — Ambulatory Visit (INDEPENDENT_AMBULATORY_CARE_PROVIDER_SITE_OTHER): Payer: Medicare Other | Admitting: *Deleted

## 2020-10-23 ENCOUNTER — Other Ambulatory Visit: Payer: Self-pay | Admitting: *Deleted

## 2020-10-23 DIAGNOSIS — Z780 Asymptomatic menopausal state: Secondary | ICD-10-CM

## 2020-10-23 DIAGNOSIS — Z0000 Encounter for general adult medical examination without abnormal findings: Secondary | ICD-10-CM

## 2020-10-23 DIAGNOSIS — Z1231 Encounter for screening mammogram for malignant neoplasm of breast: Secondary | ICD-10-CM

## 2020-10-23 NOTE — Progress Notes (Signed)
Subjective:   Virginia Parrish is a 75 y.o. female who presents for Medicare Annual (Subsequent) preventive examination.  I connected with  Virginia Parrish on 10/23/20 by a telephone enabled telemedicine application and verified that I am speaking with the correct person using two identifiers.   I discussed the limitations of evaluation and management by telemedicine. The patient expressed understanding and agreed to proceed.   Review of Systems     Cardiac Risk Factors include: advanced age (>80mn, >>62women);hypertension     Objective:    Today's Vitals   There is no height or weight on file to calculate BMI.  Advanced Directives 10/23/2020 10/13/2019 10/04/2018 08/11/2018 05/20/2017  Does Patient Have a Medical Advance Directive? Yes Yes No Yes No  Type of [place]amed[place]of [place] will - - -  Does patient want to make changes to medical advance directive? - - - Yes (MAU/Ambulatory/Procedural Areas - Information given) -  Copy of [place]? Yes - validated most recent copy scanned in chart (See row information) No - copy requested - - -  Would patient like information on creating a medical advance directive? - - No - Patient declined - Yes (MAU/Ambulatory/Procedural Areas - Information given)    Current Medications (verified) Outpatient Encounter Medications as of 10/23/2020  Medication Sig   allopurinol (ZYLOPRIM) 300 MG tablet Take 1 tablet (300 mg total) by mouth daily. ** Need to schedule an appt for further refills**   CALCIUM PO Take by mouth daily.   Cholecalciferol (VITAMIN D-3) 5000 units TABS Take 1 tablet by mouth daily.   levothyroxine (SYNTHROID) 50 MCG tablet TAKE ONE TABLET BY MOUTH DAILY   pantoprazole (PROTONIX) 40 MG tablet Take 1 tablet (40 mg total) by mouth 2 (two) times daily.   spironolactone-hydrochlorothiazide (ALDACTAZIDE) 25-25 MG tablet Take 1 tablet by mouth daily. **  need to schedule an appointment before next fill**   vitamin E 1000 UNIT capsule Take 1,000 Units by mouth daily.   clotrimazole-betamethasone (LOTRISONE) cream Apply 1 application topically as needed. (Patient not taking: No sig reported)   indomethacin (INDOCIN) 50 MG capsule Take 1 capsule (50 mg total) by mouth 3 (three) times daily with meals. (Patient not taking: No sig reported)   No facility-administered encounter medications on file as of 10/23/2020.    Allergies (verified) Patient has no known allergies.   History: Past Medical History:  Diagnosis Date   Allergy    Arthritis    Colon polyp    benign   Gallstones    GERD (gastroesophageal reflux disease)    Gout    History of chicken pox    Hypertension    Thyroid disease    Past Surgical History:  Procedure Laterality Date   ABDOMINAL HYSTERECTOMY  2007   Complete due to cervical cancer   CHOLECYSTECTOMY  2003   [facility]     [place] [facility]   EYE SURGERY Bilateral 05/12/2018   cataract sx   HERNIA REPAIR  2014-2015   abdominal hernia with mesh   KNEE SURGERY     bilateral knee replacement   WISDOM TOOTH EXTRACTION     Family History  Problem Relation Age of Onset   Arthritis Mother    Hearing loss Mother    Hyperlipidemia Mother    Hypertension Mother    Kidney disease Mother    Arthritis Father    Heart disease Father  Drug abuse Maternal Grandmother    Arthritis Maternal Grandfather    Drug abuse Paternal Grandfather    Esophageal cancer Neg Hx    Colon cancer Neg Hx    Social History   Socioeconomic History   Marital status: Married    Spouse name: Not on file   Number of children: Not on file   Years of education: Not on file   Highest education level: Not on file  Occupational History   Occupation: Retired  Tobacco Use   Smoking status: Former    Types: Cigarettes    Quit date: 12/15/1971    Years since quitting: 48.8   Smokeless tobacco: Never    Tobacco comments:    only smoked in high school and college  Vaping Use   Vaping Use: Never used  Substance and Sexual Activity   Alcohol use: Yes    Comment: wine with dinner   Drug use: No   Sexual activity: Yes    Partners: Male  Other Topics Concern   Not on file  Social History Narrative   Not on file   Social Determinants of Health   Financial Resource Strain: Low Risk    Difficulty of Paying Living Expenses: Not hard at all  Food Insecurity: No Food Insecurity   Worried About Charity fundraiser in the Last Year: Never true   [place] in the Last Year: Never true  Transportation Needs: No Transportation Needs   Lack of Transportation (Medical): No   Lack of Transportation (Non-Medical): No  Physical Activity: Insufficiently Active   Days of Exercise per Week: 3 days   Minutes of Exercise per Session: 20 min  Stress: No Stress Concern Present   Feeling of Stress : Not at all  Social Connections: Socially Integrated   Frequency of Communication with Friends and Family: More than three times a week   Frequency of Social Gatherings with Friends and Family: More than three times a week   Attends Religious Services: More than 4 times per year   Active Member of [employer] or Organizations: Yes   Attends Archivist Meetings: 1 to 4 times per year   Marital Status: Married    Tobacco Counseling Counseling given: Not Answered Tobacco comments: only smoked in high school and college   Clinical Intake:  Pre-visit preparation completed: Yes  Pain : 0-10     Nutritional Risks: None Diabetes: No  How often do you need to have someone help you when you read instructions, pamphlets, or other written materials from your doctor or pharmacy?: 1 - Never  Diabetic?    NO  Interpreter Needed?: No  Information entered by :: Leroy Kennedy LPN   Activities of Daily Living In your present state of health, do you have any difficulty performing the following  activities: 10/23/2020  Hearing? N  Vision? N  Difficulty concentrating or making decisions? N  Walking or climbing stairs? N  Dressing or bathing? N  Doing errands, shopping? N  Preparing Food and eating ? N  Using the Toilet? N  In the past six months, have you accidently leaked urine? N  Do you have problems with loss of bowel control? N  Managing your Medications? N  Managing your Finances? N  Housekeeping or managing your Housekeeping? N  Some recent data might be hidden    Patient Care Team: Ronnald Nian, DO as PCP - General (Family Medicine)  Indicate any recent Medical Services you may have  received from other than [facility] providers in the past year (date may be approximate).     Assessment:   This is a routine wellness examination for Virginia Parrish.  Hearing/Vision screen Hearing Screening - Comments:: No trouble hearing Vision Screening - Comments:: Up to date Dr. Satira Sark  Dietary issues and exercise activities discussed: Current Exercise Habits: Home exercise routine, Type of exercise: walking, Time (Minutes): 25, Frequency (Times/Week): 3, Weekly Exercise (Minutes/Week): 75, Intensity: Mild   Goals Addressed             This Visit's Progress    DIET - INCREASE WATER INTAKE   On track    Weight (lb) < 200 lb (90.7 kg)       More exercise        Depression Screen PHQ 2/9 Scores 10/23/2020 10/13/2019 08/11/2018 03/03/2018 05/20/2017 01/12/2017  PHQ - 2 Score 0 0 0 0 0 0    Fall Risk Fall Risk  10/23/2020 10/13/2019 08/11/2018 03/03/2018 05/20/2017  Falls in the past year? 0 0 0 0 No  Number falls in past yr: 0 0 - - -  Injury with Fall? 0 0 - - -  Follow up Falls evaluation completed;Education provided;Falls prevention discussed Falls prevention discussed - Falls evaluation completed -    FALL RISK PREVENTION PERTAINING TO THE HOME:  Any stairs in or around the home? No  If so, are there any without handrails? No  Home free of loose throw rugs in walkways, pet  beds, electrical cords, etc? Yes  Adequate lighting in your home to reduce risk of falls? Yes   ASSISTIVE DEVICES UTILIZED TO PREVENT FALLS:  Life alert? No  Use of a cane, walker or w/c? No  Grab bars in the bathroom? Yes  Shower chair or bench in shower? No  Elevated toilet seat or a handicapped toilet? Yes   TIMED UP AND GO:  Was the test performed? No .    Cognitive Function:  Normal cognitive status assessed by direct observation by this Nurse Health Advisor. No abnormalities found.   MMSE - Mini Mental State Exam 05/20/2017  Orientation to time 5  Orientation to Place 5  Registration 3  Attention/ Calculation 5  Recall 3  Language- name 2 objects 2  Language- repeat 1  Language- follow 3 step command 3  Language- read & follow direction 1  Write a sentence 1  Copy design 1  Total score 30        Immunizations Immunization History  Administered Date(s) Administered   Fluad Quad(high Dose 65+) 10/26/2018   Influenza, High Dose Seasonal PF 10/28/2016, 10/16/2017   Influenza-Unspecified 11/09/2019   Moderna Sars-Covid-2 Vaccination 03/14/2019, 04/11/2019   Pneumococcal Conjugate-13 03/03/2018   Pneumococcal Polysaccharide-23 12/08/2019   Zoster Recombinat (Shingrix) 10/09/2019    TDAP status: Due, Education has been provided regarding the importance of this vaccine. Advised may receive this vaccine at local pharmacy or Health Dept. Aware to provide a copy of the vaccination record if obtained from local pharmacy or Health Dept. Verbalized acceptance and understanding.  Flu Vaccine status: Up to date  Pneumococcal vaccine status: Up to date  Covid-19 vaccine status: Information provided on how to obtain vaccines.   Qualifies for Shingles Vaccine? No   Zostavax completed No   Shingrix Completed?: Yes  Screening Tests Health Maintenance  Topic Date Due   Hepatitis C Screening  Never done   TETANUS/TDAP  Never done   COVID-19 Vaccine (3 - Booster for  Moderna series) 09/11/2019  Zoster Vaccines- Shingrix (2 of 2) 12/04/2019   Fecal DNA (Cologuard)  08/13/2020   INFLUENZA VACCINE  09/10/2020   MAMMOGRAM  10/30/2021   DEXA SCAN  Completed   PNA vac Low Risk Adult  Completed   HPV VACCINES  Aged Out    Health Maintenance  Health Maintenance Due  Topic Date Due   Hepatitis C Screening  Never done   TETANUS/TDAP  Never done   COVID-19 Vaccine (3 - Booster for Moderna series) 09/11/2019   Zoster Vaccines- Shingrix (2 of 2) 12/04/2019   Fecal DNA (Cologuard)  08/13/2020   INFLUENZA VACCINE  09/10/2020    Colorectal cancer screening: Type of screening: Colonoscopy. Completed 2021. Repeat every 10 years  Mammogram status: Completed  . Repeat every year  Bone Density status: Completed  . Results reflect: Bone density results: OSTEOPOROSIS. Repeat every 2 years.  Lung Cancer Screening: (Low Dose CT Chest recommended if Age 55-80 years, 30 pack-year currently smoking OR have quit w/in 15years.) does not qualify.   Lung Cancer Screening Referral:   Additional Screening:  Hepatitis C Screening: does qualify;   Vision Screening: Recommended annual ophthalmology exams for early detection of glaucoma and other disorders of the eye. Is the patient up to date with their annual eye exam?  Yes  Who is the provider or what is the name of the office in which the patient attends annual eye exams? Dr. Satira Sark If pt is not established with a provider, would they like to be referred to a provider to establish care? Yes .   Dental Screening: Recommended annual dental exams for proper oral hygiene  Community Resource Referral / Chronic Care Management: CRR required this visit?  No   CCM required this visit?  No      Plan:     I have personally reviewed and noted the following in the patient's chart:   Medical and social history Use of alcohol, tobacco or illicit drugs  Current medications and supplements including opioid prescriptions.   Functional ability and status Nutritional status Physical activity Advanced directives List of other physicians Hospitalizations, surgeries, and ER visits in previous 12 months Vitals Screenings to include cognitive, depression, and falls Referrals and appointments  In addition, I have reviewed and discussed with patient certain preventive protocols, quality metrics, and best practice recommendations. A written personalized care plan for preventive services as well as general preventive health recommendations were provided to patient.     Leroy Kennedy, LPN   [phone]   Nurse Notes:

## 2020-10-23 NOTE — Patient Instructions (Signed)
Virginia Parrish , Thank you for taking time to come for your Medicare Wellness Visit. I appreciate your ongoing commitment to your health goals. Please review the following plan we discussed and let me know if I can assist you in the future.   Screening recommendations/referrals: Colonoscopy: up to date Mammogram: up to date Bone Density: up todate Recommended yearly ophthalmology/optometry visit for glaucoma screening and checkup Recommended yearly dental visit for hygiene and checkup  Vaccinations: Influenza vaccine: up to date Pneumococcal vaccine: up to date Tdap vaccine: Education provided Shingles vaccine: Education provided    Advanced directives: on file  Conditions/risks identified:      Preventive Care 19 Years and Older, Female Preventive care refers to lifestyle choices and visits with your health care provider that can promote health and wellness. What does preventive care include? A yearly physical exam. This is also called an annual well check. Dental exams once or twice a year. Routine eye exams. Ask your health care provider how often you should have your eyes checked. Personal lifestyle choices, including: Daily care of your teeth and gums. Regular physical activity. Eating a healthy diet. Avoiding tobacco and drug use. Limiting alcohol use. Practicing safe sex. Taking low-dose aspirin every day. Taking vitamin and mineral supplements as recommended by your health care provider. What happens during an annual well check? The services and screenings done by your health care provider during your annual well check will depend on your age, overall health, lifestyle risk factors, and family history of disease. Counseling  Your health care provider may ask you questions about your: Alcohol use. Tobacco use. Drug use. Emotional well-being. Home and relationship well-being. Sexual activity. Eating habits. History of falls. Memory and ability to understand  (cognition). Work and work Statistician. Reproductive health. Screening  You may have the following tests or measurements: Height, weight, and BMI. Blood pressure. Lipid and cholesterol levels. These may be checked every 5 years, or more frequently if you are over 84 years old. Skin check. Lung cancer screening. You may have this screening every year starting at age 35 if you have a 30-pack-year history of smoking and currently smoke or have quit within the past 15 years. Fecal occult blood test (FOBT) of the stool. You may have this test every year starting at age 66. Flexible sigmoidoscopy or colonoscopy. You may have a sigmoidoscopy every 5 years or a colonoscopy every 10 years starting at age 5. Hepatitis C blood test. Hepatitis B blood test. Sexually transmitted disease (STD) testing. Diabetes screening. This is done by checking your blood sugar (glucose) after you have not eaten for a while (fasting). You may have this done every 1-3 years. Bone density scan. This is done to screen for osteoporosis. You may have this done starting at age 66. Mammogram. This may be done every 1-2 years. Talk to your health care provider about how often you should have regular mammograms. Talk with your health care provider about your test results, treatment options, and if necessary, the need for more tests. Vaccines  Your health care provider may recommend certain vaccines, such as: Influenza vaccine. This is recommended every year. Tetanus, diphtheria, and acellular pertussis (Tdap, Td) vaccine. You may need a Td booster every 10 years. Zoster vaccine. You may need this after age 59. Pneumococcal 13-valent conjugate (PCV13) vaccine. One dose is recommended after age 4. Pneumococcal polysaccharide (PPSV23) vaccine. One dose is recommended after age 50. Talk to your health care provider about which screenings and vaccines you need  and how often you need them. This information is not intended to  replace advice given to you by your health care provider. Make sure you discuss any questions you have with your health care provider. Document Released: 02/23/2015 Document Revised: 10/17/2015 Document Reviewed: 11/28/2014 Elsevier Interactive Patient Education  2017 [employer] Prevention in the Home Falls can cause injuries. They can happen to people of all ages. There are many things you can do to make your home safe and to help prevent falls. What can I do on the outside of my home? Regularly fix the edges of walkways and driveways and fix any cracks. Remove anything that might make you trip as you walk through a door, such as a raised step or threshold. Trim any bushes or trees on the path to your home. Use bright outdoor lighting. Clear any walking paths of anything that might make someone trip, such as rocks or tools. Regularly check to see if handrails are loose or broken. Make sure that both sides of any steps have handrails. Any raised decks and porches should have guardrails on the edges. Have any leaves, snow, or ice cleared regularly. Use sand or salt on walking paths during winter. Clean up any spills in your garage right away. This includes oil or grease spills. What can I do in the bathroom? Use night lights. Install grab bars by the toilet and in the tub and shower. Do not use towel bars as grab bars. Use non-skid mats or decals in the tub or shower. If you need to sit down in the shower, use a plastic, non-slip stool. Keep the floor dry. Clean up any water that spills on the floor as soon as it happens. Remove soap buildup in the tub or shower regularly. Attach bath mats securely with double-sided non-slip rug tape. Do not have throw rugs and other things on the floor that can make you trip. What can I do in the bedroom? Use night lights. Make sure that you have a light by your bed that is easy to reach. Do not use any sheets or blankets that are too big for  your bed. They should not hang down onto the floor. Have a firm chair that has side arms. You can use this for support while you get dressed. Do not have throw rugs and other things on the floor that can make you trip. What can I do in the kitchen? Clean up any spills right away. Avoid walking on wet floors. Keep items that you use a lot in easy-to-reach places. If you need to reach something above you, use a strong step stool that has a grab bar. Keep electrical cords out of the way. Do not use floor polish or wax that makes floors slippery. If you must use wax, use non-skid floor wax. Do not have throw rugs and other things on the floor that can make you trip. What can I do with my stairs? Do not leave any items on the stairs. Make sure that there are handrails on both sides of the stairs and use them. Fix handrails that are broken or loose. Make sure that handrails are as long as the stairways. Check any carpeting to make sure that it is firmly attached to the stairs. Fix any carpet that is loose or worn. Avoid having throw rugs at the top or bottom of the stairs. If you do have throw rugs, attach them to the floor with carpet tape. Make sure that you  have a light switch at the top of the stairs and the bottom of the stairs. If you do not have them, ask someone to add them for you. What else can I do to help prevent falls? Wear shoes that: Do not have high heels. Have rubber bottoms. Are comfortable and fit you well. Are closed at the toe. Do not wear sandals. If you use a stepladder: Make sure that it is fully opened. Do not climb a closed stepladder. Make sure that both sides of the stepladder are locked into place. Ask someone to hold it for you, if possible. Clearly mark and make sure that you can see: Any grab bars or handrails. First and last steps. Where the edge of each step is. Use tools that help you move around (mobility aids) if they are needed. These  include: Canes. Walkers. Scooters. Crutches. Turn on the lights when you go into a dark area. Replace any light bulbs as soon as they burn out. Set up your furniture so you have a clear path. Avoid moving your furniture around. If any of your floors are uneven, fix them. If there are any pets around you, be aware of where they are. Review your medicines with your doctor. Some medicines can make you feel dizzy. This can increase your chance of falling. Ask your doctor what other things that you can do to help prevent falls. This information is not intended to replace advice given to you by your health care provider. Make sure you discuss any questions you have with your health care provider. Document Released: 11/23/2008 Document Revised: 07/05/2015 Document Reviewed: 03/03/2014 Elsevier Interactive Patient Education  2017 [employer].

## 2020-11-06 ENCOUNTER — Encounter: Payer: Self-pay | Admitting: Family

## 2020-11-06 ENCOUNTER — Other Ambulatory Visit: Payer: Self-pay

## 2020-11-06 ENCOUNTER — Ambulatory Visit (INDEPENDENT_AMBULATORY_CARE_PROVIDER_SITE_OTHER): Payer: Medicare Other | Admitting: Family

## 2020-11-06 VITALS — HR 65 | Temp 97.8°F | Ht 64.0 in | Wt 241.4 lb

## 2020-11-06 DIAGNOSIS — K219 Gastro-esophageal reflux disease without esophagitis: Secondary | ICD-10-CM

## 2020-11-06 DIAGNOSIS — M109 Gout, unspecified: Secondary | ICD-10-CM

## 2020-11-06 DIAGNOSIS — E039 Hypothyroidism, unspecified: Secondary | ICD-10-CM

## 2020-11-06 DIAGNOSIS — I10 Essential (primary) hypertension: Secondary | ICD-10-CM

## 2020-11-06 MED ORDER — ALLOPURINOL 300 MG PO TABS: 300.0000 mg | ORAL_TABLET | Freq: Every day | ORAL | 0 refills | Status: DC

## 2020-11-06 MED ORDER — SPIRONOLACTONE-HCTZ 25-25 MG PO TABS: 1.0000 | ORAL_TABLET | Freq: Every day | ORAL | 0 refills | Status: DC

## 2020-11-06 MED ORDER — LEVOTHYROXINE SODIUM 50 MCG PO TABS: 50.0000 ug | ORAL_TABLET | Freq: Every day | ORAL | 0 refills | Status: DC

## 2020-11-06 NOTE — Progress Notes (Signed)
Virginia Parrish is a 75 y.o. female with the following history as recorded in EpicCare:  Patient Active Problem List   Diagnosis Date Noted   Hypothyroidism 01/12/2017   GERD (gastroesophageal reflux disease) 01/12/2017   HTN (hypertension) 01/12/2017   Gout 01/12/2017    Current Outpatient Medications  Medication Sig Dispense Refill   CALCIUM PO Take by mouth daily.     Cholecalciferol (VITAMIN D-3) 5000 units TABS Take 1 tablet by mouth daily.     clotrimazole-betamethasone (LOTRISONE) cream Apply 1 application topically as needed.     pantoprazole (PROTONIX) 40 MG tablet Take 1 tablet (40 mg total) by mouth 2 (two) times daily. 84 tablet 0   allopurinol (ZYLOPRIM) 300 MG tablet Take 1 tablet (300 mg total) by mouth daily. 90 tablet 0   indomethacin (INDOCIN) 50 MG capsule Take 1 capsule (50 mg total) by mouth 3 (three) times daily with meals. (Patient not taking: No sig reported) 90 capsule 1   levothyroxine (SYNTHROID) 50 MCG tablet Take 1 tablet (50 mcg total) by mouth daily. 90 tablet 0   spironolactone-hydrochlorothiazide (ALDACTAZIDE) 25-25 MG tablet Take 1 tablet by mouth daily. 90 tablet 0   No current facility-administered medications for this visit.    Allergies: Patient has no known allergies.  Past Medical History:  Diagnosis Date   Allergy    Arthritis    Colon polyp    benign   Gallstones    GERD (gastroesophageal reflux disease)    Gout    History of chicken pox    Hypertension    Thyroid disease     Past Surgical History:  Procedure Laterality Date   ABDOMINAL HYSTERECTOMY  2007   Complete due to cervical cancer   CHOLECYSTECTOMY  2003   COLONOSCOPY     around 2011 in [place] cologuard around 2016   EYE SURGERY Bilateral 05/12/2018   cataract sx   HERNIA REPAIR  2014-2015   abdominal hernia with mesh   KNEE SURGERY     bilateral knee replacement   WISDOM TOOTH EXTRACTION      Family History  Problem Relation Age of Onset   Arthritis Mother     Hearing loss Mother    Hyperlipidemia Mother    Hypertension Mother    Kidney disease Mother    Arthritis Father    Heart disease Father    Drug abuse Maternal Grandmother    Arthritis Maternal Grandfather    Drug abuse Paternal Grandfather    Esophageal cancer Neg Hx    Colon cancer Neg Hx     Social History   Tobacco Use   Smoking status: Former    Types: Cigarettes    Quit date: 12/15/1971    Years since quitting: 48.9   Smokeless tobacco: Never   Tobacco comments:    only smoked in high school and college  Substance Use Topics   Alcohol use: Yes    Comment: wine with dinner    Subjective:  Presents as TOC; her previous PCP left office; due for CPE at the end of October; does need medication refills today;  History of hypertension, GERD, hypothyroidism; Denies any chest pain, shortness of breath, blurred vision or headache Mammogram and DEXA are scheduled for later in October;   Due for CPE in early November; planning to get flu shot and COVID booster in a few weeks at the pharmacy.      Objective:  Vitals:   11/06/20 1354  Pulse: 65  Temp: 97.8 F (  36.6 C)  TempSrc: Oral  SpO2: 96%  Weight: 241 lb 6.4 oz (109.5 kg)  Height: 5\' 4"  (1.626 m)    General: Well developed, well nourished, in no acute distress  Skin : Warm and dry.  Head: Normocephalic and atraumatic  Neck: Supple without thyromegaly, adenopathy  Lungs: Respirations unlabored; clear to auscultation bilaterally without wheeze, rales, rhonchi  CVS exam: normal rate and regular rhythm.  Neurologic: Alert and oriented; speech intact; face symmetrical; moves all extremities well; CNII-XII intact without focal deficit   Assessment:  1. Hypothyroidism, unspecified type   2. Gouty arthritis of left great toe   3. Primary hypertension   4. Gastroesophageal reflux disease, unspecified whether esophagitis present     Plan:  Refills updated; plan for CPE in about 6 weeks with fasting labs;  This visit  occurred during the SARS-CoV-2 public health emergency.  Safety protocols were in place, including screening questions prior to the visit, additional usage of staff PPE, and extensive cleaning of exam room while observing appropriate contact time as indicated for disinfecting solutions.    Return in about 6 weeks (around 12/18/2020) for fasting CPE.  No orders of the defined types were placed in this encounter.   Requested Prescriptions   Signed Prescriptions Disp Refills   levothyroxine (SYNTHROID) 50 MCG tablet 90 tablet 0    Sig: Take 1 tablet (50 mcg total) by mouth daily.   allopurinol (ZYLOPRIM) 300 MG tablet 90 tablet 0    Sig: Take 1 tablet (300 mg total) by mouth daily.   spironolactone-hydrochlorothiazide (ALDACTAZIDE) 25-25 MG tablet 90 tablet 0    Sig: Take 1 tablet by mouth daily.

## 2020-11-20 ENCOUNTER — Other Ambulatory Visit (HOSPITAL_BASED_OUTPATIENT_CLINIC_OR_DEPARTMENT_OTHER): Payer: Self-pay | Admitting: Family Medicine

## 2020-11-20 DIAGNOSIS — Z1231 Encounter for screening mammogram for malignant neoplasm of breast: Secondary | ICD-10-CM

## 2020-12-03 ENCOUNTER — Other Ambulatory Visit: Payer: Self-pay

## 2020-12-03 ENCOUNTER — Encounter (HOSPITAL_BASED_OUTPATIENT_CLINIC_OR_DEPARTMENT_OTHER): Payer: Self-pay

## 2020-12-03 ENCOUNTER — Ambulatory Visit (HOSPITAL_BASED_OUTPATIENT_CLINIC_OR_DEPARTMENT_OTHER)
Admission: RE | Admit: 2020-12-03 | Discharge: 2020-12-03 | Disposition: A | Discharge status: Home / Self Care | Payer: Medicare Other | Source: Ambulatory Visit | Attending: Family Medicine | Admitting: Family Medicine

## 2020-12-03 DIAGNOSIS — Z780 Asymptomatic menopausal state: Secondary | ICD-10-CM | POA: Insufficient documentation

## 2020-12-03 DIAGNOSIS — Z1231 Encounter for screening mammogram for malignant neoplasm of breast: Secondary | ICD-10-CM | POA: Insufficient documentation

## 2020-12-20 ENCOUNTER — Other Ambulatory Visit: Payer: Self-pay

## 2020-12-20 ENCOUNTER — Ambulatory Visit (INDEPENDENT_AMBULATORY_CARE_PROVIDER_SITE_OTHER): Payer: Medicare Other | Admitting: Family

## 2020-12-20 VITALS — BP 120/60 | HR 65 | Temp 98.1°F | Resp 18 | Ht 64.0 in | Wt 239.6 lb

## 2020-12-20 DIAGNOSIS — E559 Vitamin D deficiency, unspecified: Secondary | ICD-10-CM | POA: Diagnosis not present

## 2020-12-20 DIAGNOSIS — Z13220 Encounter for screening for lipoid disorders: Secondary | ICD-10-CM

## 2020-12-20 DIAGNOSIS — Z0000 Encounter for general adult medical examination without abnormal findings: Secondary | ICD-10-CM | POA: Diagnosis not present

## 2020-12-20 DIAGNOSIS — M1A9XX1 Chronic gout, unspecified, with tophus (tophi): Secondary | ICD-10-CM | POA: Diagnosis not present

## 2020-12-20 LAB — LIPID PANEL
Cholesterol: 195 mg/dL (ref 0–200)
HDL: 66.3 mg/dL (ref >39.00)
LDL Cholesterol: 112 mg/dL — ABNORMAL HIGH (ref 0–99)
NonHDL: 128.59
Total CHOL/HDL Ratio: 3
Triglycerides: 83 mg/dL (ref 0.0–149.0)
VLDL: 16.6 mg/dL (ref 0.0–40.0)

## 2020-12-20 LAB — CBC WITH DIFFERENTIAL/PLATELET
Basophils Absolute: 0 10*3/uL (ref 0.0–0.1)
Basophils Relative: 0.5 % (ref 0.0–3.0)
Eosinophils Absolute: 0.3 10*3/uL (ref 0.0–0.7)
Eosinophils Relative: 4.1 % (ref 0.0–5.0)
HCT: 43.3 % (ref 36.0–46.0)
Hemoglobin: 14.2 g/dL (ref 12.0–15.0)
Lymphocytes Relative: 35.5 % (ref 12.0–46.0)
Lymphs Abs: 2.5 10*3/uL (ref 0.7–4.0)
MCHC: 32.7 g/dL (ref 30.0–36.0)
MCV: 92 fl (ref 78.0–100.0)
Monocytes Absolute: 0.6 10*3/uL (ref 0.1–1.0)
Monocytes Relative: 8.1 % (ref 3.0–12.0)
Neutro Abs: 3.6 10*3/uL (ref 1.4–7.7)
Neutrophils Relative %: 51.8 % (ref 43.0–77.0)
Platelets: 247 10*3/uL (ref 150.0–400.0)
RBC: 4.7 Mil/uL (ref 3.87–5.11)
RDW: 15.1 % (ref 11.5–15.5)
WBC: 7 10*3/uL (ref 4.0–10.5)

## 2020-12-20 LAB — COMPREHENSIVE METABOLIC PANEL
ALT: 11 U/L (ref 0–35)
AST: 16 U/L (ref 0–37)
Albumin: 4.4 g/dL (ref 3.5–5.2)
Alkaline Phosphatase: 62 U/L (ref 39–117)
BUN: 19 mg/dL (ref 6–23)
CO2: 30 mEq/L (ref 19–32)
Calcium: 9.7 mg/dL (ref 8.4–10.5)
Chloride: 102 mEq/L (ref 96–112)
Creatinine, Ser: 0.93 mg/dL (ref 0.40–1.20)
GFR: 61.04 mL/min (ref >60.00)
Glucose, Bld: 118 mg/dL — ABNORMAL HIGH (ref 70–99)
Potassium: 4.5 mEq/L (ref 3.5–5.1)
Sodium: 141 mEq/L (ref 135–145)
Total Bilirubin: 0.6 mg/dL (ref 0.2–1.2)
Total Protein: 6.5 g/dL (ref 6.0–8.3)

## 2020-12-20 LAB — URIC ACID: Uric Acid, Serum: 4.2 mg/dL (ref 2.4–7.0)

## 2020-12-20 LAB — VITAMIN D 25 HYDROXY (VIT D DEFICIENCY, FRACTURES): VITD: 56.62 ng/mL (ref 30.00–100.00)

## 2020-12-20 LAB — TSH: TSH: 4.84 u[IU]/mL (ref 0.35–5.50)

## 2020-12-20 NOTE — Progress Notes (Signed)
Virginia Parrish is a 75 y.o. female with the following history as recorded in EpicCare:  Patient Active Problem List   Diagnosis Date Noted   Hypothyroidism 01/12/2017   GERD (gastroesophageal reflux disease) 01/12/2017   HTN (hypertension) 01/12/2017   Gout 01/12/2017    Current Outpatient Medications  Medication Sig Dispense Refill   allopurinol (ZYLOPRIM) 300 MG tablet Take 1 tablet (300 mg total) by mouth daily. 90 tablet 0   CALCIUM PO Take by mouth daily.     Cholecalciferol (VITAMIN D-3) 5000 units TABS Take 1 tablet by mouth daily. 2 tabs daily.     clotrimazole-betamethasone (LOTRISONE) cream Apply 1 application topically as needed.     indomethacin (INDOCIN) 50 MG capsule Take 1 capsule (50 mg total) by mouth 3 (three) times daily with meals. (Patient taking differently: Take 50 mg by mouth 3 (three) times daily with meals. As needed.) 90 capsule 1   levothyroxine (SYNTHROID) 50 MCG tablet Take 1 tablet (50 mcg total) by mouth daily. 90 tablet 0   spironolactone-hydrochlorothiazide (ALDACTAZIDE) 25-25 MG tablet Take 1 tablet by mouth daily. 90 tablet 0   pantoprazole (PROTONIX) 40 MG tablet Take 1 tablet (40 mg total) by mouth 2 (two) times daily. 84 tablet 0   No current facility-administered medications for this visit.    Allergies: Patient has no known allergies.  Past Medical History:  Diagnosis Date   Allergy    Arthritis    Colon polyp    benign   Gallstones    GERD (gastroesophageal reflux disease)    Gout    History of chicken pox    Hypertension    Thyroid disease     Past Surgical History:  Procedure Laterality Date   ABDOMINAL HYSTERECTOMY  2007   Complete due to cervical cancer   CHOLECYSTECTOMY  2003   COLONOSCOPY     around 2011 in [place] cologuard around 2016   EYE SURGERY Bilateral 05/12/2018   cataract sx   HERNIA REPAIR  2014-2015   abdominal hernia with mesh   KNEE SURGERY     bilateral knee replacement   WISDOM TOOTH EXTRACTION       Family History  Problem Relation Age of Onset   Arthritis Mother    Hearing loss Mother    Hyperlipidemia Mother    Hypertension Mother    Kidney disease Mother    Arthritis Father    Heart disease Father    Drug abuse Maternal Grandmother    Arthritis Maternal Grandfather    Drug abuse Paternal Grandfather    Esophageal cancer Neg Hx    Colon cancer Neg Hx     Social History   Tobacco Use   Smoking status: Former    Types: Cigarettes    Quit date: 12/15/1971    Years since quitting: 49.0   Smokeless tobacco: Never   Tobacco comments:    only smoked in high school and college  Substance Use Topics   Alcohol use: Yes    Comment: wine with dinner    Subjective:  Presents for yearly CPE; no acute concerns today;  Is up to date on mammogram, DEXA and colonoscopy;  Up to date on dental and vision exams;   Review of Systems  Constitutional: Negative.   HENT: Negative.    Eyes: Negative.   Respiratory: Negative.    Cardiovascular: Negative.   Gastrointestinal: Negative.   Genitourinary: Negative.   Musculoskeletal: Negative.   Skin: Negative.   Neurological: Negative.   Endo/Heme/Allergies:   Negative.   Psychiatric/Behavioral: Negative.       Objective:  Vitals:   12/20/20 0816  BP: 120/60  Pulse: 65  Resp: 18  Temp: 98.1 F (36.7 C)  TempSrc: Oral  SpO2: 100%  Weight: 239 lb 9.6 oz (108.7 kg)  Height: 5' 4" (1.626 m)    General: Well developed, well nourished, in no acute distress  Skin : Warm and dry.  Head: Normocephalic and atraumatic  Eyes: Sclera and conjunctiva clear; pupils round and reactive to light; extraocular movements intact  Lungs: Respirations unlabored; clear to auscultation bilaterally without wheeze, rales, rhonchi  CVS exam: normal rate and regular rhythm.  Musculoskeletal: No deformities; no active joint inflammation  Extremities: No edema, cyanosis, clubbing  Vessels: Symmetric bilaterally  Neurologic: Alert and oriented; speech  intact; face symmetrical; moves all extremities well; CNII-XII intact without focal deficit   Assessment:  1. PE (physical exam), annual   2. Lipid screening   3. Vitamin D deficiency   4. 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  Safety protocols were in place, including screening questions prior to the visit, additional usage of staff PPE, and extensive cleaning of exam room while observing appropriate contact time as indicated for disinfecting solutions.    No follow-ups on file.  Orders Placed This Encounter  Procedures   CBC with Differential/Platelet   Comp Met (CMET)   Lipid panel   TSH   Vitamin D (25 hydroxy)   Uric acid    Requested Prescriptions    No prescriptions requested or ordered in this encounter

## 2020-12-21 ENCOUNTER — Other Ambulatory Visit: Payer: Self-pay | Admitting: Family

## 2020-12-21 DIAGNOSIS — E039 Hypothyroidism, unspecified: Secondary | ICD-10-CM

## 2020-12-21 DIAGNOSIS — R7309 Other abnormal glucose: Secondary | ICD-10-CM

## 2020-12-21 MED ORDER — LEVOTHYROXINE SODIUM 75 MCG PO TABS: 75.0000 ug | ORAL_TABLET | Freq: Every day | ORAL | 0 refills | Status: DC

## 2020-12-25 ENCOUNTER — Encounter: Payer: Self-pay | Admitting: Family

## 2020-12-26 NOTE — Telephone Encounter (Signed)
Result Care Coordination   Result Notes  Virginia Parrish, [facility]  12/25/2020  5:40 PM EST Back to Top    Patient called.  Patient aware. Fasting lab visit scheduled for 04/16/20.    Marrian Salvage, [place]  12/21/2020  9:08 AM EST     Her TSH is at the high end of normal. I would like to increase the Synthroid slightly to 75 mcg; prefer that range to be between 2-2.5; would like to re-check labs in about 3 months- lab appointment is fine. Her fasting sugar was elevated as well- try to work on limiting intake of refined sugars/ processed fats.  Please let me know if she has questions

## 2021-01-15 ENCOUNTER — Other Ambulatory Visit: Payer: Self-pay | Admitting: Gastroenterology

## 2021-01-15 MED ORDER — PANTOPRAZOLE SODIUM 40 MG PO TBEC: 40.0000 mg | DELAYED_RELEASE_TABLET | Freq: Two times a day (BID) | ORAL | 0 refills | Status: DC

## 2021-02-04 ENCOUNTER — Emergency Department (HOSPITAL_BASED_OUTPATIENT_CLINIC_OR_DEPARTMENT_OTHER)
Admission: EM | Admit: 2021-02-04 | Discharge: 2021-02-04 | Disposition: A | Discharge status: Home / Self Care | Payer: Medicare Other | Attending: Emergency Medicine | Admitting: Emergency Medicine

## 2021-02-04 ENCOUNTER — Emergency Department (HOSPITAL_BASED_OUTPATIENT_CLINIC_OR_DEPARTMENT_OTHER): Payer: Medicare Other

## 2021-02-04 ENCOUNTER — Other Ambulatory Visit: Payer: Self-pay

## 2021-02-04 ENCOUNTER — Encounter (HOSPITAL_BASED_OUTPATIENT_CLINIC_OR_DEPARTMENT_OTHER): Payer: Self-pay | Admitting: *Deleted

## 2021-02-04 ENCOUNTER — Ambulatory Visit
Admission: EM | Admit: 2021-02-04 | Discharge: 2021-02-04 | Disposition: A | Discharge status: Home / Self Care | Payer: Medicare Other

## 2021-02-04 DIAGNOSIS — I10 Essential (primary) hypertension: Secondary | ICD-10-CM | POA: Diagnosis not present

## 2021-02-04 DIAGNOSIS — Z87891 Personal history of nicotine dependence: Secondary | ICD-10-CM | POA: Insufficient documentation

## 2021-02-04 DIAGNOSIS — W01198A Fall on same level from slipping, tripping and stumbling with subsequent striking against other object, initial encounter: Secondary | ICD-10-CM | POA: Diagnosis not present

## 2021-02-04 DIAGNOSIS — Y92009 Unspecified place in unspecified non-institutional (private) residence as the place of occurrence of the external cause: Secondary | ICD-10-CM | POA: Insufficient documentation

## 2021-02-04 DIAGNOSIS — E039 Hypothyroidism, unspecified: Secondary | ICD-10-CM | POA: Insufficient documentation

## 2021-02-04 DIAGNOSIS — S0232XA Fracture of orbital floor, left side, initial encounter for closed fracture: Secondary | ICD-10-CM | POA: Insufficient documentation

## 2021-02-04 DIAGNOSIS — S0993XA Unspecified injury of face, initial encounter: Secondary | ICD-10-CM | POA: Diagnosis present

## 2021-02-04 DIAGNOSIS — Z96653 Presence of artificial knee joint, bilateral: Secondary | ICD-10-CM | POA: Insufficient documentation

## 2021-02-04 NOTE — ED Triage Notes (Signed)
Pt presents for fall/injury to left eye. She thinks she hit a brick wall. She reports no visual  changers at this time,however eye is very swollen.

## 2021-02-04 NOTE — ED Provider Notes (Signed)
[facility] Provider Note   CSN: [id number] Arrival date & time: 02/04/21  1002     History Chief Complaint  Patient presents with   Lytle Michaels    Virginia Parrish is a 75 y.o. female.  Patient is a 75 year old female with a history of hypertension and thyroid disease who is presenting today after an injury that occurred on Christmas eve night.  Virginia Parrish was leaving her brother-in-law's house when Virginia Parrish tripped over a stump in the yard and fell forward hitting her face on a brick.  Virginia Parrish was wearing glasses at the time but still hit her face pretty hard.  Virginia Parrish has had bruising and swelling around her eye and in her cheekbone since that time.  Virginia Parrish denies any loss of consciousness.  Virginia Parrish has not had any neck pain.  Some mild pain in her right hand where Virginia Parrish caught herself.  Virginia Parrish denies any change in her vision and Virginia Parrish has had no nausea or vomiting.  Virginia Parrish went to the walk-in clinic today and they sent her here for further evaluation.  Virginia Parrish does not take any anticoagulation.  Virginia Parrish did notice that Virginia Parrish has had a little bit of a runny nose today when Virginia Parrish tried to blow her nose it caused significant pressure in her left eye.  The history is provided by the patient.  Fall      Past Medical History:  Diagnosis Date   Allergy    Arthritis    Colon polyp    benign   Gallstones    GERD (gastroesophageal reflux disease)    Gout    History of chicken pox    Hypertension    Thyroid disease     Patient Active Problem List   Diagnosis Date Noted   Hypothyroidism 01/12/2017   GERD (gastroesophageal reflux disease) 01/12/2017   HTN (hypertension) 01/12/2017   Gout 01/12/2017    Past Surgical History:  Procedure Laterality Date   ABDOMINAL HYSTERECTOMY  2007   Complete due to cervical cancer   CHOLECYSTECTOMY  2003   COLONOSCOPY     around 2011 in [place] cologuard around 2016   EYE SURGERY Bilateral 05/12/2018   cataract sx   HERNIA REPAIR  2014-2015   abdominal hernia  with mesh   KNEE SURGERY     bilateral knee replacement   WISDOM TOOTH EXTRACTION       OB History   No obstetric history on file.     Family History  Problem Relation Age of Onset   Arthritis Mother    Hearing loss Mother    Hyperlipidemia Mother    Hypertension Mother    Kidney disease Mother    Arthritis Father    Heart disease Father    Drug abuse Maternal Grandmother    Arthritis Maternal Grandfather    Drug abuse Paternal Grandfather    Esophageal cancer Neg Hx    Colon cancer Neg Hx     Social History   Tobacco Use   Smoking status: Former    Types: Cigarettes    Quit date: 12/15/1971    Years since quitting: 49.1   Smokeless tobacco: Never   Tobacco comments:    only smoked in high school and college  Vaping Use   Vaping Use: Never used  Substance Use Topics   Alcohol use: Yes    Comment: wine with dinner   Drug use: No    Home Medications Prior to Admission medications   Medication Sig Start Date End Date  Taking? Authorizing Provider  allopurinol (ZYLOPRIM) 300 MG tablet Take 1 tablet (300 mg total) by mouth daily. 11/06/20   Marrian Salvage, FNP  CALCIUM PO Take by mouth daily.    [provider]  Cholecalciferol (VITAMIN D-3) 5000 units TABS Take 1 tablet by mouth daily. 2 tabs daily.    [provider]  clotrimazole-betamethasone (LOTRISONE) cream Apply 1 application topically as needed.    [provider]  indomethacin (INDOCIN) 50 MG capsule Take 1 capsule (50 mg total) by mouth 3 (three) times daily with meals. Patient taking differently: Take 50 mg by mouth 3 (three) times daily with meals. As needed. 09/21/19   Cirigliano, Garvin Fila, DO  levothyroxine (SYNTHROID) 75 MCG tablet Take 1 tablet (75 mcg total) by mouth daily. 12/21/20   Marrian Salvage, FNP  pantoprazole (PROTONIX) 40 MG tablet Take 1 tablet (40 mg total) by mouth 2 (two) times daily. 01/15/21 03/16/21  Cirigliano, Vito V, DO   spironolactone-hydrochlorothiazide (ALDACTAZIDE) 25-25 MG tablet Take 1 tablet by mouth daily. 11/06/20   Marrian Salvage, [place]    Allergies    Patient has no known allergies.  Review of Systems   Review of Systems  All other systems reviewed and are negative.  Physical Exam Updated Vital Signs BP (!) 174/68 (BP Location: Right Arm)    Pulse (!) 52    Temp 98.3 F (36.8 C)    Resp 18    Ht 5\' 3"  (1.6 m)    Wt 104.3 kg    SpO2 96%    BMI 40.74 kg/m   Physical Exam Vitals and nursing note reviewed.  Constitutional:      General: Virginia Parrish is not in acute distress.    Appearance: Virginia Parrish is well-developed.  HENT:     Head: Normocephalic. Abrasion and contusion present.   Eyes:     Pupils: Pupils are equal, round, and reactive to light.     Comments: Extraocular movements are intact in all directions.  Pupils are 3 mm and reactive bilaterally.  Consensually reactive as well.  Gross vision is intact.  No evidence of subconjunctival hemorrhage.  Cardiovascular:     Rate and Rhythm: Normal rate and regular rhythm.     Heart sounds: Normal heart sounds. No murmur heard.   No friction rub.  Pulmonary:     Effort: Pulmonary effort is normal.     Breath sounds: Normal breath sounds. No wheezing or rales.  Abdominal:     General: Bowel sounds are normal. There is no distension.     Palpations: Abdomen is soft.     Tenderness: There is no abdominal tenderness. There is no guarding or rebound.  Musculoskeletal:        General: No tenderness. Normal range of motion.     Right wrist: Normal.       Hands:     Cervical back: Normal.     Comments: No edema  Skin:    General: Skin is warm and dry.     Findings: No rash.  Neurological:     Mental Status: Virginia Parrish is alert and oriented to person, place, and time. Mental status is at baseline.     Cranial Nerves: No cranial nerve deficit.  Psychiatric:        Mood and Affect: Mood normal.        Behavior: Behavior normal.     ED Results /  Procedures / Treatments   Labs (all labs ordered are listed, but only  abnormal results are displayed) Labs Reviewed - No data to display  EKG None  Radiology CT Head Wo Contrast  Result Date: 02/04/2021 CLINICAL DATA:  Trauma to the face and head.  Fell 2 days ago. EXAM: CT HEAD WITHOUT CONTRAST CT MAXILLOFACIAL WITHOUT CONTRAST TECHNIQUE: Multidetector CT imaging of the head and maxillofacial structures were performed using the standard protocol without intravenous contrast. Multiplanar CT image reconstructions of the maxillofacial structures were also generated. COMPARISON:  None. FINDINGS: CT HEAD FINDINGS Brain: Mild age related volume loss. No evidence of old or acute focal infarction, mass lesion, hemorrhage, hydrocephalus or extra-axial collection. Vascular: There is atherosclerotic calcification of the major vessels at the base of the brain. Skull: No skull fracture. Other: None CT MAXILLOFACIAL FINDINGS Osseous: There is and orbital floor blowout fracture on the left with displaced fragments and protrusion of orbital fat. There would be potential for inferior rectus trapping. No other facial fracture. Orbits: Right orbit is normal. Left orbital floor fracture as noted above. Orbital emphysema present on the left. Potential for trapping of the inferior rectus as noted above. Sinuses: Small amount of traumatic layering fluid in the left maxillary sinus. Other sinuses clear. Soft tissues: Soft tissue swelling of the superficial periorbital soft tissues on the left. Left orbital emphysema as noted above. IMPRESSION: Extensive orbital floor fracture on the left with fragments displaced inferiorly into the maxillary sinus. Protrusion of orbital fat. Potential for trapping of the inferior rectus muscle. Orbital emphysema on the left. No evidence of globe injury. Electronically Signed   By: Nelson Chimes M.D.   On: 02/04/2021 10:40   CT Maxillofacial Wo Contrast  Result Date: 02/04/2021 CLINICAL  DATA:  Trauma to the face and head.  Fell 2 days ago. EXAM: CT HEAD WITHOUT CONTRAST CT MAXILLOFACIAL WITHOUT CONTRAST TECHNIQUE: Multidetector CT imaging of the head and maxillofacial structures were performed using the standard protocol without intravenous contrast. Multiplanar CT image reconstructions of the maxillofacial structures were also generated. COMPARISON:  None. FINDINGS: CT HEAD FINDINGS Brain: Mild age related volume loss. No evidence of old or acute focal infarction, mass lesion, hemorrhage, hydrocephalus or extra-axial collection. Vascular: There is atherosclerotic calcification of the major vessels at the base of the brain. Skull: No skull fracture. Other: None CT MAXILLOFACIAL FINDINGS Osseous: There is and orbital floor blowout fracture on the left with displaced fragments and protrusion of orbital fat. There would be potential for inferior rectus trapping. No other facial fracture. Orbits: Right orbit is normal. Left orbital floor fracture as noted above. Orbital emphysema present on the left. Potential for trapping of the inferior rectus as noted above. Sinuses: Small amount of traumatic layering fluid in the left maxillary sinus. Other sinuses clear. Soft tissues: Soft tissue swelling of the superficial periorbital soft tissues on the left. Left orbital emphysema as noted above. IMPRESSION: Extensive orbital floor fracture on the left with fragments displaced inferiorly into the maxillary sinus. Protrusion of orbital fat. Potential for trapping of the inferior rectus muscle. Orbital emphysema on the left. No evidence of globe injury. Electronically Signed   By: Nelson Chimes M.D.   On: 02/04/2021 10:40    Procedures Procedures   Medications Ordered in ED Medications - No data to display  ED Course  I have reviewed the triage vital signs and the nursing notes.  Pertinent labs & imaging results that were available during my care of the patient were reviewed by me and considered in my  medical decision making (see chart for details).  MDM Rules/Calculators/A&P                         Elderly female presenting today after a fall 2 days ago and hitting her face.  Virginia Parrish had no loss of consciousness and does not take anticoagulation.  Virginia Parrish has significant trauma around the left eye but vision, extraocular movements are all intact.  Virginia Parrish does not appear to have injury to the eye itself.  CT of the head is negative for intracranial injury.  CT of the face shows an extensive orbital floor fracture with inferior displacement.  Virginia Parrish has no evidence of extraocular muscle entrapment at this time.   12:06 PM Spoke with Dr. Merri Ray who will see the patient later this week.  Patient cautioned against nose blowing and to use saline spray as needed.  Virginia Parrish will continue to do Tylenol or ibuprofen as needed for pain and ice.  MDM   Amount and/or Complexity of Data Reviewed Tests in the radiology section of CPT: ordered and reviewed Independent visualization of images, tracings, or specimens: yes       Final Clinical Impression(s) / ED Diagnoses Final diagnoses:  Closed fracture of left orbital floor, initial encounter [facility])    Rx / DC Orders ED Discharge Orders     None        Blanchie Dessert, MD 02/04/21 1208

## 2021-02-04 NOTE — Discharge Instructions (Signed)
Avoid any nose blowing.  Use saline spray as needed.  Tylenol/ibuprofen are fine for pain and ice.  Also elevated your head to help with swelling.

## 2021-02-04 NOTE — ED Provider Notes (Signed)
Patient states that last night she was leaving her son's home, she was trying to navigate between the driveway and his house and tripped over uneven level of dirt in the ground, states she fell toward the house, caught her self with her right hand, bruised her right wrist but was unable to avoid her head hitting the wall as well on her left side.  Patient states she was wearing glasses at the time, states they are not shatter but very scratched, feels grateful that she had them on.  Today, patient presents with significant swelling and abrasions on both her upper and lower eyelid on the left, there is also significant edema in the medial aspect of her lower lid patient states that due to the swelling, she is having hard time determining whether or not she is had any vision changes.  Patient states that when she blew her nose this morning, she also felt significant pressure on the inside of her left sinus/behind her left eye.  Patient states she is concerned she may have shattered bones.  Per my observation, skin is completely intact.  Brief eye examination revealed that the patient's left pupil is not constricting as well as the right.  Patient advised that managing is warranted at this time, recommend patient go to the emergency room for CT scan.  Patient was brought to urgent care by her husband who states he plans to take her now.   Lynden Oxford Scales, [place] 02/04/21 [phone]

## 2021-02-04 NOTE — ED Notes (Signed)
Patient is being discharged from the Urgent Care and sent to the Emergency Department via [place]. Per L. Blanchie Serve, patient is in need of higher level of care due to Fall and need for further evaluation. Patient is aware and verbalizes understanding of plan of care.  Vitals:   02/04/21 0936  BP: (!) 145/75  Pulse: 78  Resp: 18  Temp: 98.6 F (37 C)  SpO2: 100%

## 2021-02-04 NOTE — ED Triage Notes (Signed)
Pt tripped and fell Dec 24th hitting her left eye/cheek on a brick, went to UC this am, was sent here for further test. No thinners

## 2021-02-04 NOTE — ED Notes (Signed)
Pt provided discharge instructions and prescription information. Pt was given the opportunity to ask questions and questions were answered. Discharge signature not obtained in the setting of the COVID-19 pandemic in order to reduce high touch surfaces.  ° °

## 2021-02-05 ENCOUNTER — Encounter: Payer: Self-pay | Admitting: Plastic Surgery

## 2021-02-05 ENCOUNTER — Ambulatory Visit: Payer: Medicare Other | Admitting: Plastic Surgery

## 2021-02-05 DIAGNOSIS — S0230XA Fracture of orbital floor, unspecified side, initial encounter for closed fracture: Secondary | ICD-10-CM

## 2021-02-05 DIAGNOSIS — S0232XA Fracture of orbital floor, left side, initial encounter for closed fracture: Secondary | ICD-10-CM | POA: Diagnosis not present

## 2021-02-05 HISTORY — DX: Fracture of orbital floor, unspecified side, initial encounter for closed fracture: S02.30XA

## 2021-02-05 NOTE — Progress Notes (Signed)
Patient ID: Virginia Parrish, female    DOB: 09-27-1945, 75 y.o.   MRN: [id number]   Chief Complaint  Patient presents with   Consult   Facial Injury    The patient is a 75 year old female here for evaluation of her face.  Yesterday she tripped while walking to her car and struck her face.  She has significant bruising at the periorbital area on the left side.  I do not feel a step-off but she is very swollen and tender.  The CT scan done in the emergency room showed extensive orbital floor fracture with displacement inferiorly into the maxillary sinus.  She does not have any inferior rectus or other muscle entrapment.  Patient states her vision is intact and similar to prior to the accident.   Review of Systems  Constitutional: Negative.   Eyes: Negative.   Respiratory: Negative.    Cardiovascular: Negative.   Gastrointestinal: Negative.   Endocrine: Negative.   Genitourinary: Negative.   Musculoskeletal: Negative.   Skin: Negative.   Psychiatric/Behavioral: Negative.     Past Medical History:  Diagnosis Date   Allergy    Arthritis    Colon polyp    benign   Gallstones    GERD (gastroesophageal reflux disease)    Gout    History of chicken pox    Hypertension    Thyroid disease     Past Surgical History:  Procedure Laterality Date   ABDOMINAL HYSTERECTOMY  2007   Complete due to cervical cancer   CHOLECYSTECTOMY  2003   COLONOSCOPY     around 2011 in [place] cologuard around 2016   EYE SURGERY Bilateral 05/12/2018   cataract sx   HERNIA REPAIR  2014-2015   abdominal hernia with mesh   KNEE SURGERY     bilateral knee replacement   WISDOM TOOTH EXTRACTION        Current Outpatient Medications:    allopurinol (ZYLOPRIM) 300 MG tablet, Take 1 tablet (300 mg total) by mouth daily., Disp: 90 tablet, Rfl: 0   CALCIUM PO, Take by mouth daily., Disp: , Rfl:    clotrimazole-betamethasone (LOTRISONE) cream, Apply 1 application topically as needed., Disp: , Rfl:     indomethacin (INDOCIN) 50 MG capsule, Take 1 capsule (50 mg total) by mouth 3 (three) times daily with meals. (Patient taking differently: Take 50 mg by mouth 3 (three) times daily with meals. As needed.), Disp: 90 capsule, Rfl: 1   levothyroxine (SYNTHROID) 75 MCG tablet, Take 1 tablet (75 mcg total) by mouth daily., Disp: 90 tablet, Rfl: 0   pantoprazole (PROTONIX) 40 MG tablet, Take 1 tablet (40 mg total) by mouth 2 (two) times daily., Disp: 60 tablet, Rfl: 0   spironolactone-hydrochlorothiazide (ALDACTAZIDE) 25-25 MG tablet, Take 1 tablet by mouth daily., Disp: 90 tablet, Rfl: 0   Objective:   Vitals:   02/05/21 1415  BP: (!) 142/78  Pulse: (!) 59  SpO2: 97%    Physical Exam Vitals reviewed.  HENT:     Head: Normocephalic.  Cardiovascular:     Rate and Rhythm: Normal rate.     Pulses: Normal pulses.  Pulmonary:     Effort: Pulmonary effort is normal. No respiratory distress.  Musculoskeletal:        General: Swelling present.  Skin:    General: Skin is warm.     Capillary Refill: Capillary refill takes less than 2 seconds.     Coloration: Skin is not jaundiced.     Findings: Bruising  present.  Neurological:     Mental Status: She is alert and oriented to person, place, and time.  Psychiatric:        Mood and Affect: Mood normal.        Behavior: Behavior normal.        Thought Content: Thought content normal.        Judgment: Judgment normal.    Assessment & Plan:  Closed fracture of left orbital floor, initial encounter [facility])  We discussed the options for treatment.  She can have surgical repair with a plate placed.  She can wait and see if it settles down and she continues to have no visual disturbance.  With less than 50% of the orbital floor affected it is certainly reasonable to give it time.  She would like to do this.  We will therefore plan to see her back in 7 to 10 days.  If anything changes in the meantime she knows to give [place] a call.  Pictures were obtained  of the patient and placed in the chart with the patient's or guardian's permission.   [place], DO

## 2021-02-07 ENCOUNTER — Telehealth: Payer: Self-pay

## 2021-02-07 NOTE — Telephone Encounter (Signed)
Returned patients call. Discussed with Dr. Marla Roe, it is normal to have facial numbness on the left side for several months from the closed fracture of the left orbital injury. Reminded patient of follow up appointment on 02/12/2021 at 1:40pm with [facility].

## 2021-02-07 NOTE — Telephone Encounter (Signed)
Patient called to say she is feeling numbness on the left side of her face, mostly in her lip.  She would like to know if this is something she should be concerned about.  Please call.

## 2021-02-12 ENCOUNTER — Other Ambulatory Visit: Payer: Self-pay

## 2021-02-12 ENCOUNTER — Ambulatory Visit: Payer: Medicare Other | Admitting: Surgical

## 2021-02-12 ENCOUNTER — Other Ambulatory Visit: Payer: Self-pay | Admitting: Gastroenterology

## 2021-02-12 DIAGNOSIS — S0232XA Fracture of orbital floor, left side, initial encounter for closed fracture: Secondary | ICD-10-CM | POA: Diagnosis not present

## 2021-02-12 NOTE — Progress Notes (Signed)
° °  Referring Provider Virginia Parrish, Virginia Parrish [address] [place],  [place] [address]   CC: No chief complaint on file.     Virginia Parrish is an 76 y.o. female.  HPI: Patient is a 76 year old female here for follow-up of her facial fractures.  She tripped while walking on her driveway and struck her face on a brick.  She had a CT scan in the emergency room which showed extensive orbital floor fracture with displacement inferiorly into the maxillary sinus.  She reports that she is doing well.  She denies any vision changes.  She does not have any difficulty with range of motion of EOMs.  She denies double vision.  Bruising and swelling is improving.   Review of Systems  Constitutional: Negative.   Eyes:  Negative for blurred vision, double vision, photophobia, pain, discharge and redness.    Physical Exam Vitals with BMI 02/05/2021 02/04/2021 02/04/2021  Height 5\' 4"  - -  Weight 230 lbs - -  BMI 82.80 - -  Systolic 034 917 915  Diastolic 78 67 68  Pulse 59 55 52    Physical Exam Constitutional:      General: She is not in acute distress.    Appearance: Normal appearance. She is not ill-appearing.  HENT:     Head: Normocephalic.  Eyes:     General: Lids are normal. Vision grossly intact.     Extraocular Movements:     Left eye: Normal extraocular motion.     Conjunctiva/sclera:     Left eye: Left conjunctiva is not injected. No exudate or hemorrhage. Neurological:     Mental Status: She is alert.    Assessment/Plan 76 year old female status post facial trauma after falling on her driveway and striking her face on a brick.  CT scan in the emergency room showed extensive orbital floor fracture on the left side.  She is overall doing well, has some bruising of her left face that is improving.  She has normal EOMs and normal vision.  She previously discussed surgical intervention versus watching and waiting.  She has been doing well since her initial  consultation with [place].  No changes.  Improvement in the swelling.  We will continue to monitor, recommend scheduling telemetry visit in 5 weeks to rediscuss.  She knows to call if symptoms worsen.  Discussed reasons for return for evaluation including vision changes, difficulty with EOMs.  Pictures were obtained of the patient and placed in the chart with the patient's or guardian's permission.   Virginia Parrish 02/12/2021, 10:51 AM

## 2021-03-07 ENCOUNTER — Other Ambulatory Visit: Payer: Self-pay

## 2021-03-07 ENCOUNTER — Other Ambulatory Visit: Payer: Self-pay | Admitting: Family

## 2021-03-07 MED ORDER — LEVOTHYROXINE SODIUM 75 MCG PO TABS: 75.0000 ug | ORAL_TABLET | Freq: Every day | ORAL | 0 refills | Status: DC

## 2021-03-11 ENCOUNTER — Other Ambulatory Visit: Payer: Self-pay | Admitting: Gastroenterology

## 2021-03-12 ENCOUNTER — Other Ambulatory Visit: Payer: Self-pay | Admitting: Family

## 2021-03-12 ENCOUNTER — Other Ambulatory Visit: Payer: Self-pay | Admitting: Gastroenterology

## 2021-03-12 DIAGNOSIS — M109 Gout, unspecified: Secondary | ICD-10-CM

## 2021-03-12 MED ORDER — SPIRONOLACTONE-HCTZ 25-25 MG PO TABS: 1.0000 | ORAL_TABLET | Freq: Every day | ORAL | 1 refills | Status: DC

## 2021-03-12 MED ORDER — ALLOPURINOL 300 MG PO TABS: 300.0000 mg | ORAL_TABLET | Freq: Every day | ORAL | 1 refills | Status: DC

## 2021-03-13 MED ORDER — PANTOPRAZOLE SODIUM 40 MG PO TBEC: 40.0000 mg | DELAYED_RELEASE_TABLET | Freq: Two times a day (BID) | ORAL | 0 refills | Status: DC

## 2021-03-13 NOTE — Telephone Encounter (Signed)
Patient needs to have an appointment for further refills

## 2021-03-19 ENCOUNTER — Telehealth: Payer: Medicare Other | Admitting: Surgical

## 2021-03-19 ENCOUNTER — Other Ambulatory Visit: Payer: Self-pay

## 2021-03-19 DIAGNOSIS — S0232XA Fracture of orbital floor, left side, initial encounter for closed fracture: Secondary | ICD-10-CM

## 2021-03-19 NOTE — Progress Notes (Signed)
° °  Referring Provider Marrian Salvage, [address] [place],   [address]   CC:  Chief Complaint  Patient presents with   Follow-up      Virginia Parrish is an 76 y.o. female.  HPI: Patient is a 76 year old female here for televisit follow-up of her facial fractures.  She tripped while walking on her driveway and struck her face on a brick which resulted in an extensive orbital floor fracture with displacement inferiorly into the maxillary sinus.  She saw Dr. Marla Roe for this, they elected nonoperative treatment moving forward unless she developed new or concerning symptoms.  She has been doing well.  She reports "Everything is really good, I'm Amazed".  She also reports that she still notices some puffiness above the bone on the left side of her face, reports the bones still feels sore with applying make-up or sleeping on the side.  The patient gave consent to have this visit done by telemedicine / virtual visit, two identifiers were used to identify patient. This is also consent for access the chart and treat the patient via this visit. The patient is located in [place].  I, the provider, am at the office.  We spent 8 minutes together for the visit.  Joined by telephone.  She reports that she is not having any vision changes, has no difficulty with EOMs, no double vision.   Review of Systems HEENT: No vision changes, no double vision  Physical Exam Vitals with BMI 02/05/2021 02/04/2021 02/04/2021  Height 5\' 4"  - -  Weight 230 lbs - -  BMI 20.25 - -  Systolic 427 062 376  Diastolic 78 67 68  Pulse 59 55 52    General:  No acute distress,  Alert and oriented, Non-Toxic, Normal speech and affect Normal mood and behavior  Assessment/Plan 76 year old female status post facial trauma with resulting orbital floor fracture with displacement inferiorly into the maxillary sinus.  It has been 6 weeks since her injury.  She is overall doing really well,  has no visual complaints.  She does feel as if she has some mild swelling still present.  Discussed with patient that this may continue to improve over the next few weeks.  We discussed no additional CT scans were necessary unless patient developed any changes in her symptoms.  We discussed concerning symptoms.  Recommend following up as needed, calling with questions or concerns.  Carola Rhine Chayla Shands 03/19/2021, 1:03 PM

## 2021-04-16 ENCOUNTER — Other Ambulatory Visit (INDEPENDENT_AMBULATORY_CARE_PROVIDER_SITE_OTHER): Payer: Medicare Other

## 2021-04-16 DIAGNOSIS — E039 Hypothyroidism, unspecified: Secondary | ICD-10-CM

## 2021-04-16 DIAGNOSIS — R7309 Other abnormal glucose: Secondary | ICD-10-CM | POA: Diagnosis not present

## 2021-04-16 LAB — HEMOGLOBIN A1C: Hgb A1c MFr Bld: 5.8 % (ref 4.6–6.5)

## 2021-04-17 LAB — THYROID PANEL WITH TSH
Free Thyroxine Index: 2.9 (ref 1.4–3.8)
T3 Uptake: 28 % (ref 22–35)
T4, Total: 10.5 ug/dL (ref 5.1–11.9)
TSH: 1.14 mIU/L (ref 0.40–4.50)

## 2021-06-14 ENCOUNTER — Other Ambulatory Visit: Payer: Self-pay | Admitting: Family

## 2021-06-14 ENCOUNTER — Other Ambulatory Visit: Payer: Self-pay | Admitting: Gastroenterology

## 2021-07-05 ENCOUNTER — Telehealth: Payer: Self-pay

## 2021-07-05 ENCOUNTER — Telehealth: Payer: Self-pay | Admitting: Gastroenterology

## 2021-07-05 ENCOUNTER — Other Ambulatory Visit: Payer: Self-pay | Admitting: Family

## 2021-07-05 IMAGING — MG DIGITAL SCREENING BILATERAL MAMMOGRAM WITH TOMO AND CAD
6 of 10 series · 6 of 30 positions shown · non-contrast
Comparison: Previous exam(s).

CLINICAL DATA: Screening.

EXAM:
DIGITAL SCREENING BILATERAL MAMMOGRAM WITH TOMO AND CAD

[R MLO synth-2D]
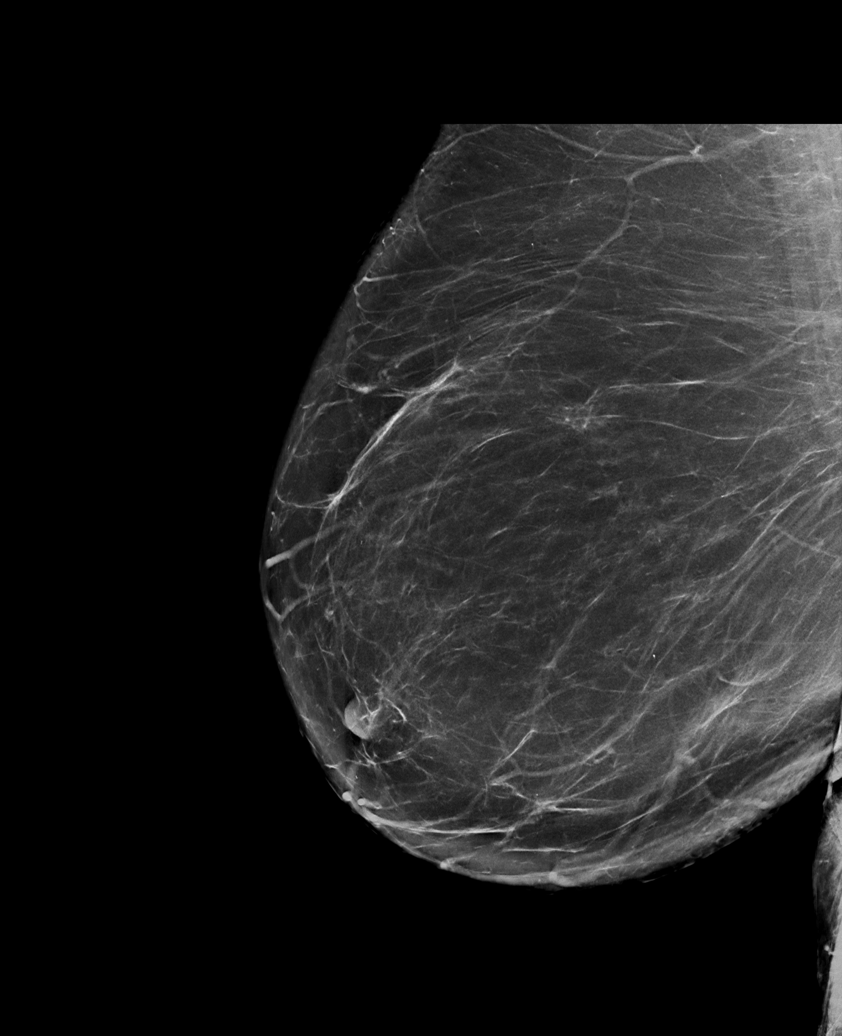

[R CV synth-2D]
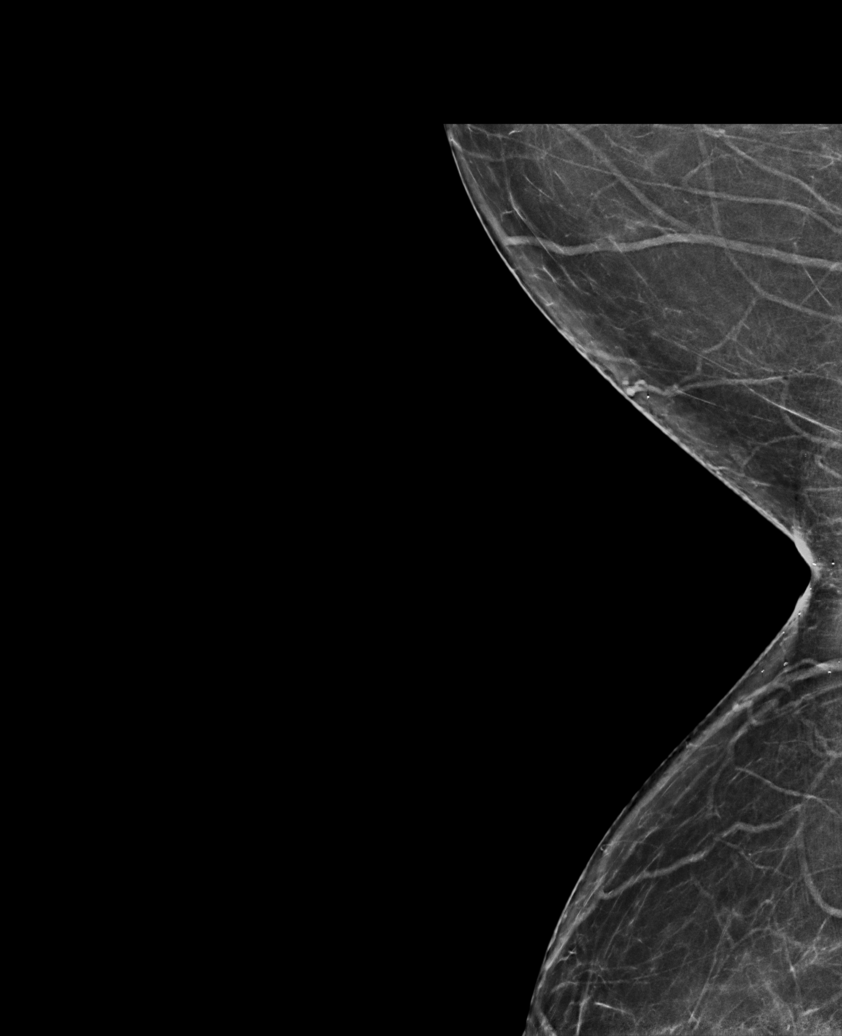

[L MLO synth-2D]
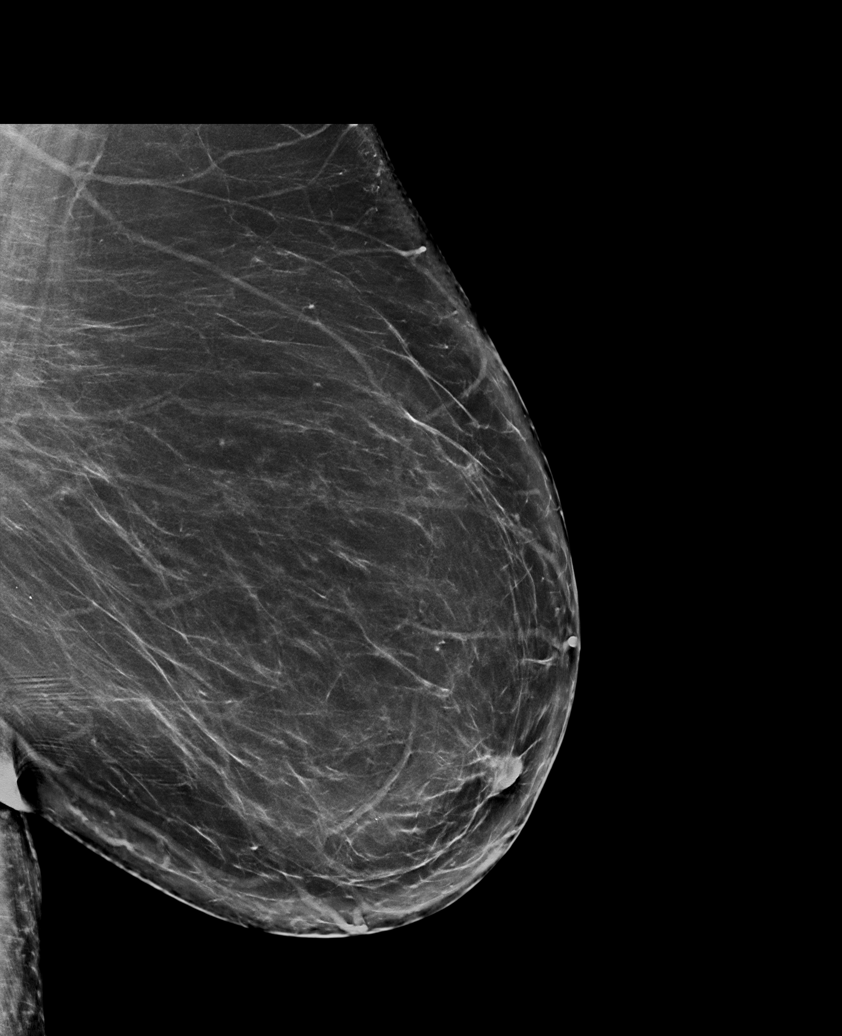

[L CC synth-2D]
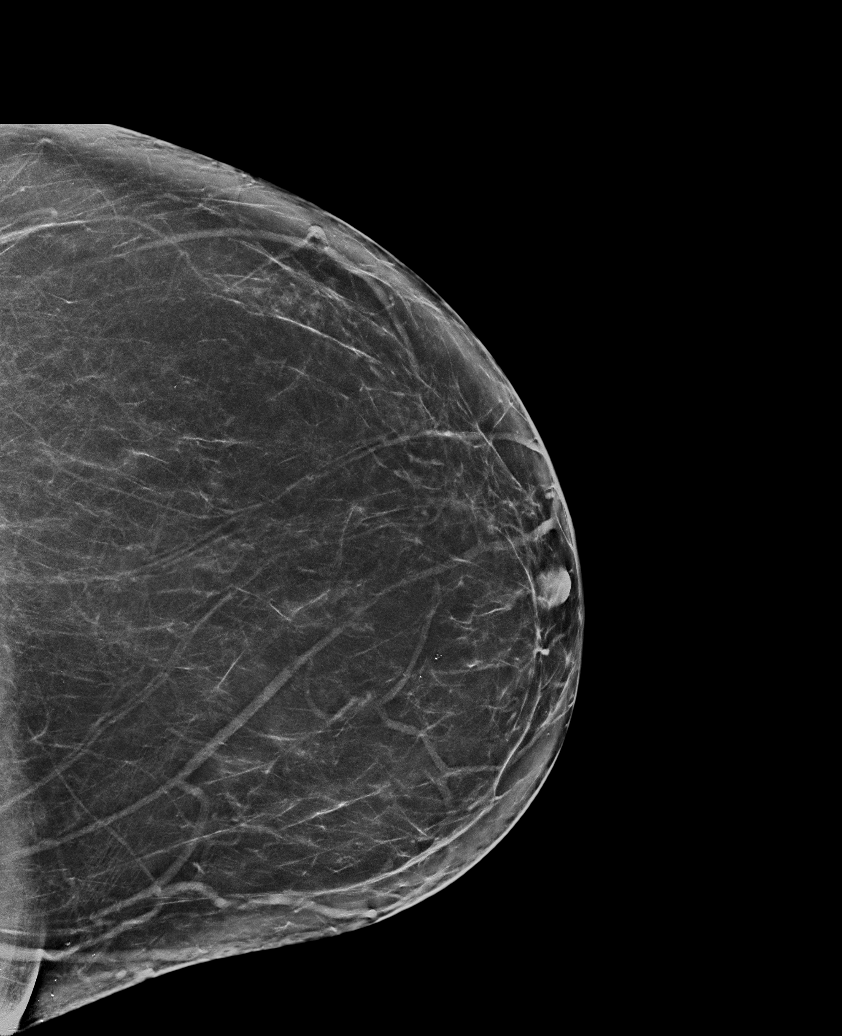

[R CC synth-2D]
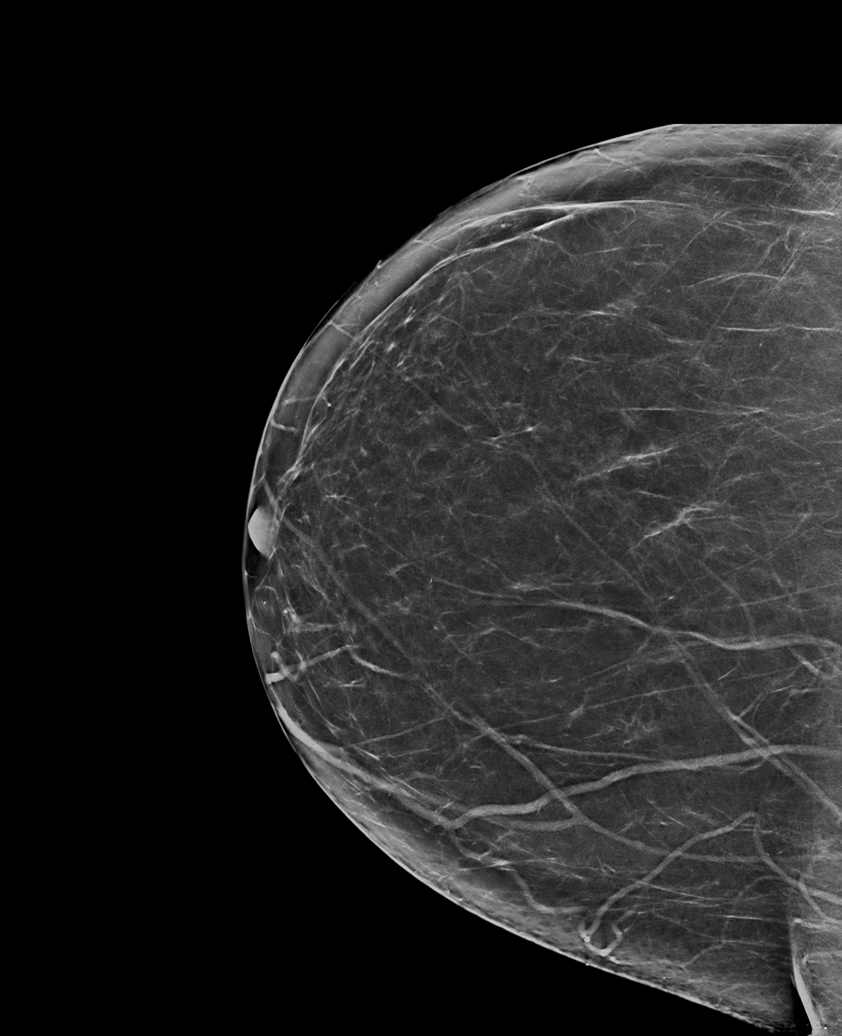

[R CC tomo · tomo slice 40/79.0]
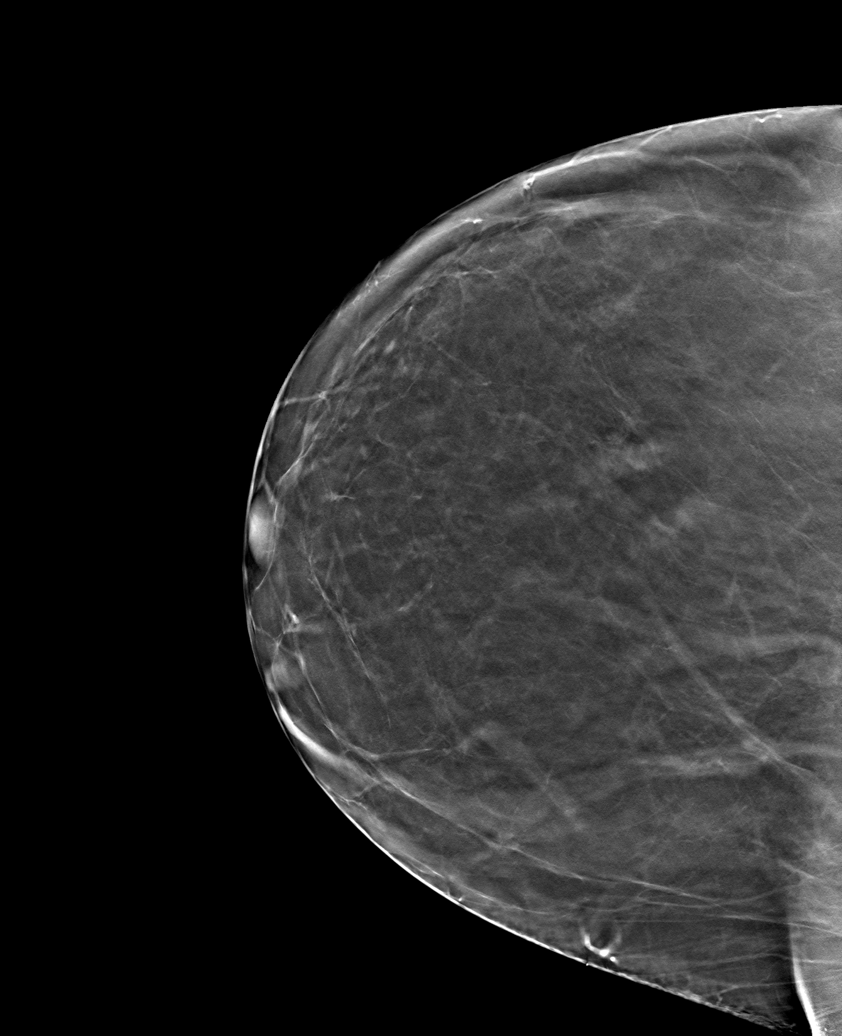

[6 of 30 positions shown; findings below may reference images not displayed]

ACR Breast Density Category b: There are scattered areas of
fibroglandular density.
FINDINGS: There are no findings suspicious for malignancy. Images were
processed with CAD.
IMPRESSION: No mammographic evidence of malignancy. A result letter of this
screening mammogram will be mailed directly to the patient.

RECOMMENDATION:
Screening mammogram in one year. (Code:[id number])

BI-RADS CATEGORY  1: Negative.

## 2021-07-05 MED ORDER — PANTOPRAZOLE SODIUM 40 MG PO TBEC: 40.0000 mg | DELAYED_RELEASE_TABLET | Freq: Two times a day (BID) | ORAL | 0 refills | Status: DC

## 2021-07-05 NOTE — Telephone Encounter (Signed)
Medication: pantoprazole (PROTONIX) 40 MG tablet  Has the patient contacted their pharmacy? No. Pt states this med was given by Mickel Baas and she only saw Dr. Bryan Lemma for a specific concern  Preferred Pharmacy (with phone number or street name):  [id number] [place], [place] - [address]  [address] [place], [place] [address]  Phone:  [phone]  Fax:  [phone]   Agent: Please be advised that RX refills may take up to 3 business days. We ask that you follow-up with your pharmacy.

## 2021-07-05 NOTE — Telephone Encounter (Signed)
Called Pt and was Advise and made appointment to discuss Rx refill

## 2021-07-05 NOTE — Telephone Encounter (Signed)
done

## 2021-07-09 ENCOUNTER — Ambulatory Visit (INDEPENDENT_AMBULATORY_CARE_PROVIDER_SITE_OTHER): Payer: Medicare Other | Admitting: Family

## 2021-07-09 VITALS — BP 130/72 | HR 65 | Temp 97.7°F | Resp 18 | Ht 64.0 in | Wt 235.2 lb

## 2021-07-09 DIAGNOSIS — R0609 Other forms of dyspnea: Secondary | ICD-10-CM | POA: Diagnosis not present

## 2021-07-09 DIAGNOSIS — R7309 Other abnormal glucose: Secondary | ICD-10-CM

## 2021-07-09 DIAGNOSIS — E039 Hypothyroidism, unspecified: Secondary | ICD-10-CM | POA: Diagnosis not present

## 2021-07-09 DIAGNOSIS — M109 Gout, unspecified: Secondary | ICD-10-CM | POA: Diagnosis not present

## 2021-07-09 DIAGNOSIS — I10 Essential (primary) hypertension: Secondary | ICD-10-CM

## 2021-07-09 DIAGNOSIS — K219 Gastro-esophageal reflux disease without esophagitis: Secondary | ICD-10-CM

## 2021-07-09 MED ORDER — ALLOPURINOL 300 MG PO TABS: 300.0000 mg | ORAL_TABLET | Freq: Every day | ORAL | 1 refills | Status: DC

## 2021-07-09 MED ORDER — INDOMETHACIN 50 MG PO CAPS: 50.0000 mg | ORAL_CAPSULE | Freq: Three times a day (TID) | ORAL | 1 refills | Status: AC

## 2021-07-09 MED ORDER — PANTOPRAZOLE SODIUM 40 MG PO TBEC: 40.0000 mg | DELAYED_RELEASE_TABLET | Freq: Every day | ORAL | 3 refills | Status: DC

## 2021-07-09 MED ORDER — SPIRONOLACTONE-HCTZ 25-25 MG PO TABS: 1.0000 | ORAL_TABLET | Freq: Every day | ORAL | 1 refills | Status: DC

## 2021-07-09 MED ORDER — CLOTRIMAZOLE-BETAMETHASONE 1-0.05 % EX CREA: 1.0000 "application " | TOPICAL_CREAM | CUTANEOUS | 0 refills | Status: AC | PRN

## 2021-07-09 NOTE — Progress Notes (Signed)
Debbera Wolken is a 76 y.o. female with the following history as recorded in EpicCare:  Patient Active Problem List   Diagnosis Date Noted   Orbital floor fracture ([place]) 02/05/2021   Hypothyroidism 01/12/2017   GERD (gastroesophageal reflux disease) 01/12/2017   HTN (hypertension) 01/12/2017   Gout 01/12/2017    Current Outpatient Medications  Medication Sig Dispense Refill   CALCIUM PO Take by mouth daily.     levothyroxine (SYNTHROID) 75 MCG tablet TAKE ONE TABLET BY MOUTH DAILY 90 tablet 0   allopurinol (ZYLOPRIM) 300 MG tablet Take 1 tablet (300 mg total) by mouth daily. 90 tablet 1   clotrimazole-betamethasone (LOTRISONE) cream Apply 1 application. topically as needed. 45 g 0   indomethacin (INDOCIN) 50 MG capsule Take 1 capsule (50 mg total) by mouth 3 (three) times daily with meals. As needed. 90 capsule 1   pantoprazole (PROTONIX) 40 MG tablet Take 1 tablet (40 mg total) by mouth daily. 90 tablet 3   spironolactone-hydrochlorothiazide (ALDACTAZIDE) 25-25 MG tablet Take 1 tablet by mouth daily. 90 tablet 1   No current facility-administered medications for this visit.    Allergies: Patient has no known allergies.  Past Medical History:  Diagnosis Date   Allergy    Arthritis    Colon polyp    benign   Gallstones    GERD (gastroesophageal reflux disease)    Gout    History of chicken pox    Hypertension    Thyroid disease     Past Surgical History:  Procedure Laterality Date   ABDOMINAL HYSTERECTOMY  2007   Complete due to cervical cancer   CHOLECYSTECTOMY  2003   COLONOSCOPY     around 2011 in [place] cologuard around 2016   EYE SURGERY Bilateral 05/12/2018   cataract sx   HERNIA REPAIR  2014-2015   abdominal hernia with mesh   KNEE SURGERY     bilateral knee replacement   WISDOM TOOTH EXTRACTION      Family History  Problem Relation Age of Onset   Arthritis Mother    Hearing loss Mother    Hyperlipidemia Mother    Hypertension Mother    Kidney  disease Mother    Arthritis Father    Heart disease Father    Drug abuse Maternal Grandmother    Arthritis Maternal Grandfather    Drug abuse Paternal Grandfather    Esophageal cancer Neg Hx    Colon cancer Neg Hx     Social History   Tobacco Use   Smoking status: Former    Types: Cigarettes    Quit date: 12/15/1971    Years since quitting: 49.6   Smokeless tobacco: Never   Tobacco comments:    only smoked in high school and college  Substance Use Topics   Alcohol use: Yes    Comment: wine with dinner    Subjective:  Follow up on GERD- would like to discuss management of her medication; has been getting from GI but is doing well on current regimen and asking to have taken over here;      Objective:  Vitals:   07/09/21 0922  BP: 130/72  Pulse: 65  Resp: 18  Temp: 97.7 F (36.5 C)  TempSrc: Oral  SpO2: 96%  Weight: 235 lb 3.2 oz (106.7 kg)  Height: '5\' 4"'$  (1.626 m)    General: Well developed, well nourished, in no acute distress  Skin : Warm and dry.  Head: Normocephalic and atraumatic  Lungs: Respirations unlabored; clear to  auscultation bilaterally without wheeze, rales, rhonchi  CVS exam: normal rate and regular rhythm.  Neurologic: Alert and oriented; speech intact; face symmetrical; moves all extremities well; CNII-XII intact without focal deficit  Assessment:  1. DOE (dyspnea on exertion)   2. Gouty arthritis of left great toe   3. Gastroesophageal reflux disease, unspecified whether esophagitis present   4. Hypothyroidism, unspecified type   5. Primary hypertension   6. Elevated glucose     Plan:  Suspect deconditioning as symptoms sound chronic in nature; will get cardiac clearance but assuming this is normal patient is encouraged to start exercising regularly to work on weight loss goals; Stable; refill updated; Stable; refill updated; Stable- lab was checked in March 2023; Stable; continue same medication; History of pre-diabetes- encouraged to  limit sugar intake; she will work on losing 20 pounds by next OV in 6 months.   Return in about 6 months (around 12/25/2021).  Orders Placed This Encounter  Procedures   Ambulatory referral to Cardiology    Referral Priority:   Routine    Referral Type:   Consultation    Referral Reason:   Specialty Services Required    Requested Specialty:   Cardiology    Number of Visits Requested:   1    Requested Prescriptions   Signed Prescriptions Disp Refills   pantoprazole (PROTONIX) 40 MG tablet 90 tablet 3    Sig: Take 1 tablet (40 mg total) by mouth daily.   clotrimazole-betamethasone (LOTRISONE) cream 45 g 0    Sig: Apply 1 application. topically as needed.   indomethacin (INDOCIN) 50 MG capsule 90 capsule 1    Sig: Take 1 capsule (50 mg total) by mouth 3 (three) times daily with meals. As needed.   allopurinol (ZYLOPRIM) 300 MG tablet 90 tablet 1    Sig: Take 1 tablet (300 mg total) by mouth daily.   spironolactone-hydrochlorothiazide (ALDACTAZIDE) 25-25 MG tablet 90 tablet 1    Sig: Take 1 tablet by mouth daily.

## 2021-08-29 ENCOUNTER — Other Ambulatory Visit: Payer: Self-pay

## 2021-08-29 DIAGNOSIS — K635 Polyp of colon: Secondary | ICD-10-CM | POA: Insufficient documentation

## 2021-08-29 DIAGNOSIS — M1990 Unspecified osteoarthritis, unspecified site: Secondary | ICD-10-CM | POA: Insufficient documentation

## 2021-08-29 DIAGNOSIS — K8020 Calculus of gallbladder without cholecystitis without obstruction: Secondary | ICD-10-CM | POA: Insufficient documentation

## 2021-08-29 DIAGNOSIS — Z8619 Personal history of other infectious and parasitic diseases: Secondary | ICD-10-CM | POA: Insufficient documentation

## 2021-08-29 DIAGNOSIS — T7840XA Allergy, unspecified, initial encounter: Secondary | ICD-10-CM | POA: Insufficient documentation

## 2021-09-04 NOTE — Progress Notes (Unsigned)
Cardiology Office Note:    Date:  09/05/2021   ID:  [name], DOB Jun 26, 1945, MRN [id number]  PCP:  Marrian Salvage, [place]  Cardiologist:  Shirlee More, MD   Referring MD: Marrian Salvage,*  ASSESSMENT:    1. SOB (shortness of breath) on exertion   2. Nonspecific abnormal electrocardiogram (ECG) (EKG)    PLAN:    In order of problems listed above:  In retrospect I think her symptoms were subtle likely due to deconditioning of improved and I have asked her to continue her walking program advancing up to 150 minutes/week and told her to concentrate in duration not speed intensity or elevation. Unfortunately EKGs often have overlap patterns between normals and abnormals and I think she should have an echocardiogram done to define if she has a structurally normal heart.  Next appointment as normal of her echocardiogram is reassuring   Medication Adjustments/Labs and Tests Ordered: Current medicines are reviewed at length with the patient today.  Concerns regarding medicines are outlined above.  No orders of the defined types were placed in this encounter.  No orders of the defined types were placed in this encounter.   Chief complaint I had shortness of breath beginning a walking program  History of Present Illness:    Virginia Parrish is a 76 y.o. female with a history of hypertension and hypothyroidism who is being seen today for the evaluation of exertional shortness of breath at the request of Marrian Salvage,*.  She had an EKG January 2020 showed sinus bradycardia 52 bpm otherwise normal EKG.  Chart review shows no previous cardiology evaluation or diagnostic cardiac imaging performed.  She is a retired Software engineer second career [employer]  She started walking program and had some shortness of breath outdoors on an incline not severe not limiting not associated with chest discomfort and issues continue to program has improved and now can  walk on a treadmill comfortably without incline. No edema orthopnea chest pain palpitation or syncope She smoked briefly for 2 years in college.  She has no occupational lung exposure does not cough or wheeze She has no known history of heart disease congenital rheumatic however years ago after several deaths in her family her Apple Watch gave her an alert for atrial fibrillation but it was never documented. Many years ago she saw cardiologist in [place] she is not sure what prompted it but she was told that she was well did not have heart disease and had no diagnostic testing done. 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   CALCIUM PO Take 1 tablet by mouth daily.   clotrimazole-betamethasone (LOTRISONE) cream Apply 1 application. topically as needed.   indomethacin (INDOCIN) 50 MG capsule Take 1 capsule (50 mg total) by mouth 3 (three) times daily with meals. As needed.   levothyroxine (SYNTHROID) 75 MCG tablet TAKE ONE TABLET BY MOUTH DAILY   pantoprazole (PROTONIX) 40 MG tablet Take 1 tablet (40 mg total) by mouth daily.   spironolactone-hydrochlorothiazide (ALDACTAZIDE)  25-25 MG tablet Take 1 tablet by mouth daily.     Allergies:   Patient has no known allergies.   Social History   Socioeconomic History   Marital status: Married    Spouse name: Not on file   Number of children: Not on file   Years of education: Not on file   Highest education level: Not on file  Occupational History   Occupation: Retired  Tobacco Use   Smoking status: Former    Types: Cigarettes    Quit date: 12/15/1971    Years since quitting: 49.7   Smokeless tobacco: Never   Tobacco comments:    only smoked in high school and college  Vaping Use   Vaping Use: Never used  Substance and Sexual Activity   Alcohol use: Yes    Comment: wine with dinner   Drug use: No   Sexual activity: Yes    Partners: Male  Other Topics Concern   Not on file  Social History Narrative   Not on file   Social Determinants of Health   Financial Resource Strain: Low Risk  (10/23/2020)   Overall Financial Resource Strain (CARDIA)    Difficulty of Paying Living Expenses: Not hard at all  Food Insecurity: No [place] (10/23/2020)   Hunger Vital Sign    Worried About Running Out of Food in the Last Year: Never true    [place] in the Last Year: Never true  Transportation Needs: No Transportation Needs (10/23/2020)   PRAPARE - Hydrologist (Medical): No    Lack of Transportation (Non-Medical): No  Physical Activity: Insufficiently Active (10/23/2020)   Exercise Vital Sign    Days of Exercise per Week: 3 days    Minutes of Exercise per Session: 20 min  Stress: No Stress Concern Present (10/23/2020)   [place]    Feeling of Stress : Not at all  Social Connections: [place] (10/23/2020)   Social Connection and Isolation Panel [NHANES]    Frequency of Communication with Friends and Family: More than three times a week    Frequency of Social Gatherings with Friends and Family:  More than three times a week    Attends Religious Services: More than 4 times per year    Active Member of [employer] or Organizations: Yes    Attends Archivist Meetings: 1 to 4 times per year    Marital Status: Married     Family History: The patient's family history includes Arthritis in her father, maternal grandfather, and mother; Drug abuse in her maternal grandmother and paternal grandfather; Hearing loss in her mother; Heart disease in her father; Hyperlipidemia in her mother; Hypertension in her mother; Kidney disease in her mother. There is no history of Esophageal cancer or Colon cancer.  ROS:   ROS Please see the history of present illness.     All other systems reviewed and are negative.  EKGs/Labs/Other Studies Reviewed:  The following studies were reviewed today:   EKG:  EKG is  ordered today.  The ekg ordered today is personally reviewed and demonstrates sinus rhythm she has abnormal R wave progression representing lead placement or old anterior septal MI has Q waves in inferior leads I think they are insignificant but there is a difficult EKG because it is an overlap between normal and abnormal.  Previous EKGs in 2020 did not show this pattern  Recent Labs: 12/20/2020: ALT 11; BUN 19; Creatinine, Ser 0.93; Hemoglobin 14.2; Platelets 247.0; Potassium 4.5; Sodium 141 04/16/2021: TSH 1.14  Recent Lipid Panel    Component Value Date/Time   CHOL 195 12/20/2020 0840   TRIG 83.0 12/20/2020 0840   HDL 66.30 12/20/2020 0840   CHOLHDL 3 12/20/2020 0840   VLDL 16.6 12/20/2020 0840   LDLCALC 112 (H) 12/20/2020 0840    Physical Exam:    VS:  BP 128/80   Pulse 69   Ht '5\' 4"'$  (1.626 m)   Wt 229 lb 1.3 oz (103.9 kg)   SpO2 96%   BMI 39.32 kg/m     Wt Readings from Last 3 Encounters:  09/05/21 229 lb 1.3 oz (103.9 kg)  07/09/21 235 lb 3.2 oz (106.7 kg)  02/05/21 230 lb (104.3 kg)     GEN: Obese BMI 39 well nourished, well developed in no acute distress HEENT:  Normal NECK: No JVD; No carotid bruits LYMPHATICS: No lymphadenopathy CARDIAC: RRR, no murmurs, rubs, gallops RESPIRATORY:  Clear to auscultation without rales, wheezing or rhonchi  ABDOMEN: Soft, non-tender, non-distended MUSCULOSKELETAL:  No edema; No deformity  SKIN: Warm and dry NEUROLOGIC:  Alert and oriented x 3 PSYCHIATRIC:  Normal affect     Signed, Shirlee More, MD  09/05/2021 10:25 AM    [place]

## 2021-09-05 ENCOUNTER — Ambulatory Visit: Payer: Medicare Other | Admitting: Cardiology

## 2021-09-05 ENCOUNTER — Encounter: Payer: Self-pay | Admitting: Cardiology

## 2021-09-05 VITALS — BP 128/80 | HR 69 | Ht 64.0 in | Wt 229.1 lb

## 2021-09-05 DIAGNOSIS — R9431 Abnormal electrocardiogram [ECG] [EKG]: Secondary | ICD-10-CM

## 2021-09-05 DIAGNOSIS — R0602 Shortness of breath: Secondary | ICD-10-CM | POA: Diagnosis not present

## 2021-09-05 NOTE — Patient Instructions (Signed)
Medication Instructions:  Your physician recommends that you continue on your current medications as directed. Please refer to the Current Medication list given to you today.  *If you need a refill on your cardiac medications before your next appointment, please call your pharmacy*   Lab Work: None If you have labs (blood work) drawn today and your tests are completely normal, you will receive your results only by: [place] (if you have MyChart) OR A paper copy in the mail If you have any lab test that is abnormal or we need to change your treatment, we will call you to review the results.   Testing/Procedures: Your physician has requested that you have an echocardiogram. Echocardiography is a painless test that uses sound waves to create images of your heart. It provides your doctor with information about the size and shape of your heart and how well your heart's chambers and valves are working. This procedure takes approximately one hour. There are no restrictions for this procedure.    Follow-Up: At [facility], you and your health needs are our priority.  As part of our continuing mission to provide you with exceptional heart care, we have created designated Provider Care Teams.  These Care Teams include your primary Cardiologist (physician) and Advanced Practice Providers (APPs -  Physician Assistants and Nurse Practitioners) who all work together to provide you with the care you need, when you need it.  We recommend signing up for the patient portal called "MyChart".  Sign up information is provided on this After Visit Summary.  MyChart is used to connect with patients for Virtual Visits (Telemedicine).  Patients are able to view lab/test results, encounter notes, upcoming appointments, etc.  Non-urgent messages can be sent to your provider as well.   To learn more about what you can do with MyChart, go to [email or web address].    Your next appointment:   Follow up as  needed  The format for your next appointment:   In Person  Provider:   Shirlee More, MD    Other Instructions Goal of 150 minutes per week  Important Information About Sugar

## 2021-09-11 ENCOUNTER — Other Ambulatory Visit: Payer: Self-pay | Admitting: Family

## 2021-09-25 ENCOUNTER — Other Ambulatory Visit (HOSPITAL_BASED_OUTPATIENT_CLINIC_OR_DEPARTMENT_OTHER): Payer: Medicare Other

## 2021-09-26 ENCOUNTER — Ambulatory Visit (HOSPITAL_BASED_OUTPATIENT_CLINIC_OR_DEPARTMENT_OTHER)
Admission: RE | Admit: 2021-09-26 | Discharge: 2021-09-26 | Disposition: A | Discharge status: Home / Self Care | Payer: Medicare Other | Source: Ambulatory Visit | Attending: Cardiology | Admitting: Cardiology

## 2021-09-26 DIAGNOSIS — R9431 Abnormal electrocardiogram [ECG] [EKG]: Secondary | ICD-10-CM | POA: Insufficient documentation

## 2021-09-26 DIAGNOSIS — R0602 Shortness of breath: Secondary | ICD-10-CM | POA: Diagnosis not present

## 2021-09-26 LAB — ECHOCARDIOGRAM COMPLETE
AR max vel: 3.08 cm2
AV Area VTI: 3.22 cm2
AV Area mean vel: 2.99 cm2
AV Mean grad: 3 mmHg
AV Peak grad: 4.8 mmHg
Ao pk vel: 1.1 m/s
Area-P 1/2: 2.51 cm2
S' Lateral: 2.8 cm

## 2021-10-02 ENCOUNTER — Telehealth: Payer: Self-pay

## 2021-10-02 NOTE — Telephone Encounter (Signed)
Patient notified of results.

## 2021-10-02 NOTE — Telephone Encounter (Signed)
-----   Message from Richardo Priest, MD sent at 10/01/2021  9:06 PM EDT ----- Normal or stable result  Good result no finding of old MI or heart failure

## 2021-10-31 ENCOUNTER — Telehealth: Payer: Self-pay | Admitting: Family

## 2021-10-31 NOTE — Telephone Encounter (Signed)
Left message for patient to call back and schedule Medicare Annual Wellness Visit (AWV).   Pease offer to do virtually or by telephone.  Left office number and my jabber [phone].  Last AWV:10/23/2020  Please schedule at anytime with Nurse Health Advisor.

## 2021-12-31 NOTE — Telephone Encounter (Signed)
Got it undated

## 2022-01-07 ENCOUNTER — Ambulatory Visit: Payer: Medicare Other | Admitting: Family

## 2022-01-10 ENCOUNTER — Ambulatory Visit (INDEPENDENT_AMBULATORY_CARE_PROVIDER_SITE_OTHER): Payer: Medicare Other | Admitting: Family

## 2022-01-10 ENCOUNTER — Encounter: Payer: Self-pay | Admitting: Family

## 2022-01-10 VITALS — BP 132/78 | HR 64 | Temp 97.6°F | Resp 18 | Ht 64.0 in | Wt 224.0 lb

## 2022-01-10 DIAGNOSIS — M109 Gout, unspecified: Secondary | ICD-10-CM | POA: Diagnosis not present

## 2022-01-10 DIAGNOSIS — K219 Gastro-esophageal reflux disease without esophagitis: Secondary | ICD-10-CM | POA: Diagnosis not present

## 2022-01-10 DIAGNOSIS — E039 Hypothyroidism, unspecified: Secondary | ICD-10-CM

## 2022-01-10 DIAGNOSIS — I10 Essential (primary) hypertension: Secondary | ICD-10-CM | POA: Diagnosis not present

## 2022-01-10 DIAGNOSIS — Z1231 Encounter for screening mammogram for malignant neoplasm of breast: Secondary | ICD-10-CM | POA: Diagnosis not present

## 2022-01-10 MED ORDER — LEVOTHYROXINE SODIUM 75 MCG PO TABS: 75.0000 ug | ORAL_TABLET | Freq: Every day | ORAL | 3 refills | Status: DC

## 2022-01-10 MED ORDER — ALLOPURINOL 300 MG PO TABS: 300.0000 mg | ORAL_TABLET | Freq: Every day | ORAL | 3 refills | Status: DC

## 2022-01-10 MED ORDER — PANTOPRAZOLE SODIUM 40 MG PO TBEC: 40.0000 mg | DELAYED_RELEASE_TABLET | Freq: Every day | ORAL | 3 refills | Status: DC

## 2022-01-10 MED ORDER — SPIRONOLACTONE-HCTZ 25-25 MG PO TABS: 1.0000 | ORAL_TABLET | Freq: Every day | ORAL | 3 refills | Status: DC

## 2022-01-10 NOTE — Progress Notes (Addendum)
Virginia Parrish is a 76 y.o. 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     clotrimazole-betamethasone (LOTRISONE) cream Apply 1 application. topically as needed. 45 g 0   indomethacin (INDOCIN) 50 MG capsule Take 1 capsule (50 mg total) by mouth 3 (three) times daily with meals. As needed. 90 capsule 1   allopurinol (ZYLOPRIM) 300 MG tablet Take 1 tablet (300 mg total) by mouth daily. 90 tablet 3   levothyroxine (SYNTHROID) 75 MCG tablet Take 1 tablet (75 mcg total) by mouth daily. 90 tablet 3   pantoprazole (PROTONIX) 40 MG tablet Take 1 tablet (40 mg total) by mouth daily. 90 tablet 3   spironolactone-hydrochlorothiazide (ALDACTAZIDE) 25-25 MG tablet Take 1 tablet by mouth daily. 90 tablet 3   No current facility-administered medications for this visit.    Allergies: Patient has no known allergies.  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    Past Surgical History:  Procedure Laterality Date   ABDOMINAL HYSTERECTOMY  2007   Complete due to cervical cancer   CHOLECYSTECTOMY  2003   COLONOSCOPY     around 2011 in [place] cologuard around 2016   EYE  SURGERY Bilateral 05/12/2018   cataract sx   HERNIA REPAIR  2014-2015   abdominal hernia with mesh   KNEE SURGERY     bilateral knee replacement   WISDOM TOOTH EXTRACTION      Family History  Problem Relation Age of Onset   Arthritis Mother    Hearing loss Mother    Hyperlipidemia Mother    Hypertension Mother    Kidney disease Mother    Arthritis Father    Heart disease Father    Drug abuse Maternal Grandmother    Arthritis Maternal Grandfather    Drug abuse Paternal Grandfather    Esophageal cancer Neg Hx    Colon cancer Neg Hx     Social History   Tobacco Use   Smoking status: Former    Types: Cigarettes    Quit date: 12/15/1971    Years since quitting: 50.1   Smokeless tobacco: Never   Tobacco comments:    only smoked in high school and college  Substance Use Topics   Alcohol use: Yes    Comment: wine with dinner    Subjective:   6 month follow up on chronic care needs; no acute concerns today; has purchased a treadmill and is trying to walk daily for at least 30 minutes; has lost 11 pounds since last OV;    Objective:  Vitals:   01/10/22 1017  BP: 132/78  Pulse: 64  Resp: 18  Temp: 97.6 F (36.4 C)  SpO2: 100%  Weight: 224 lb (101.6 kg)  Height: '5\' 4"'$  (1.626 m)    General: Well developed, well nourished, in no acute distress  Skin : Warm and dry.  Head: Normocephalic and atraumatic  Eyes: Sclera and conjunctiva clear; pupils round and reactive to light; extraocular movements intact  Ears: External normal; canals clear; tympanic membranes normal  Oropharynx: Pink, supple. No suspicious lesions  Neck: Supple without thyromegaly, adenopathy  Lungs: Respirations unlabored; clear to auscultation bilaterally without wheeze, rales, rhonchi  CVS exam: normal rate and regular rhythm.  Neurologic: Alert and oriented; speech intact; face symmetrical; moves all extremities well; CNII-XII intact without focal deficit   Assessment:  1. Gouty arthritis of left  great toe   2. Visit for screening mammogram   3. Primary hypertension   4. Gastroesophageal reflux disease, unspecified whether esophagitis present   5. Hypothyroidism, unspecified type     Plan:  Stable; refill updated; Order updated; Stable; refill updated; Stable; refill updated; Stable; refill updated;  Return in about 6 months (around 07/12/2022) for CPE.  Orders Placed This Encounter  Procedures   MM Digital Screening    Standing Status:   Future    Standing Expiration Date:   01/11/2023    Order Specific Question:   Reason for Exam (SYMPTOM  OR DIAGNOSIS REQUIRED)    Answer:   screening mammogram    Order Specific Question:   Preferred imaging location?    Answer:   [facility]    Requested Prescriptions   Signed Prescriptions Disp Refills   allopurinol (ZYLOPRIM) 300 MG tablet 90 tablet 3    Sig: Take 1 tablet (300 mg total) by mouth daily.   levothyroxine (SYNTHROID) 75 MCG tablet 90 tablet 3    Sig: Take 1 tablet (75 mcg total) by mouth daily.   pantoprazole (PROTONIX) 40 MG tablet 90 tablet 3    Sig: Take 1 tablet (40 mg total) by mouth daily.   spironolactone-hydrochlorothiazide (ALDACTAZIDE) 25-25 MG tablet 90 tablet 3    Sig: Take 1 tablet by mouth daily.

## 2022-02-11 ENCOUNTER — Encounter: Payer: Self-pay | Admitting: Family

## 2022-02-12 ENCOUNTER — Other Ambulatory Visit: Payer: Self-pay | Admitting: Family

## 2022-02-12 MED ORDER — AMOXICILLIN 500 MG PO CAPS: ORAL_CAPSULE | ORAL | 0 refills | Status: DC

## 2022-02-17 ENCOUNTER — Ambulatory Visit (HOSPITAL_BASED_OUTPATIENT_CLINIC_OR_DEPARTMENT_OTHER)
Admission: RE | Admit: 2022-02-17 | Discharge: 2022-02-17 | Disposition: A | Discharge status: Home / Self Care | Payer: Medicare HMO | Source: Ambulatory Visit | Attending: Family | Admitting: Family

## 2022-02-17 ENCOUNTER — Encounter (HOSPITAL_BASED_OUTPATIENT_CLINIC_OR_DEPARTMENT_OTHER): Payer: Self-pay

## 2022-02-17 DIAGNOSIS — Z1231 Encounter for screening mammogram for malignant neoplasm of breast: Secondary | ICD-10-CM | POA: Diagnosis not present

## 2022-02-18 ENCOUNTER — Other Ambulatory Visit: Payer: Self-pay | Admitting: Family

## 2022-02-18 DIAGNOSIS — R928 Other abnormal and inconclusive findings on diagnostic imaging of breast: Secondary | ICD-10-CM

## 2022-02-26 ENCOUNTER — Other Ambulatory Visit: Payer: Self-pay | Admitting: Family

## 2022-02-26 ENCOUNTER — Ambulatory Visit
Admission: RE | Admit: 2022-02-26 | Discharge: 2022-02-26 | Disposition: A | Discharge status: Home / Self Care | Payer: Medicare HMO | Source: Ambulatory Visit | Attending: Family | Admitting: Family

## 2022-02-26 DIAGNOSIS — R928 Other abnormal and inconclusive findings on diagnostic imaging of breast: Secondary | ICD-10-CM | POA: Diagnosis not present

## 2022-02-26 DIAGNOSIS — N6320 Unspecified lump in the left breast, unspecified quadrant: Secondary | ICD-10-CM

## 2022-02-26 DIAGNOSIS — N6323 Unspecified lump in the left breast, lower outer quadrant: Secondary | ICD-10-CM | POA: Diagnosis not present

## 2022-02-26 DIAGNOSIS — N6321 Unspecified lump in the left breast, upper outer quadrant: Secondary | ICD-10-CM | POA: Diagnosis not present

## 2022-03-05 ENCOUNTER — Other Ambulatory Visit: Payer: Self-pay | Admitting: Family

## 2022-03-05 DIAGNOSIS — M109 Gout, unspecified: Secondary | ICD-10-CM

## 2022-03-20 ENCOUNTER — Telehealth: Payer: Self-pay | Admitting: Family

## 2022-03-20 NOTE — Telephone Encounter (Signed)
Copied from [place] [id number]. Topic: Medicare AWV >> Mar 20, 2022  2:44 PM Devoria Glassing wrote: Reason for CRM: Left message for patient to schedule Annual Wellness Visit(AWV).  Please schedule with Health Nurse Advisor at [facility]. Please call [phone] ask for [facility].

## 2022-04-21 ENCOUNTER — Ambulatory Visit (INDEPENDENT_AMBULATORY_CARE_PROVIDER_SITE_OTHER): Payer: Medicare HMO | Admitting: *Deleted

## 2022-04-21 DIAGNOSIS — Z0000 Encounter for general adult medical examination without abnormal findings: Secondary | ICD-10-CM

## 2022-04-21 NOTE — Patient Instructions (Signed)
Virginia Parrish , Thank you for taking time to come for your Medicare Wellness Visit. I appreciate your ongoing commitment to your health goals. Please review the following plan we discussed and let me know if I can assist you in the future.     This is a list of the screening recommended for you and due dates:  Health Maintenance  Topic Date Due   Hepatitis C Screening: USPSTF Recommendation to screen - Ages 71-75 yo.  Never done   DTaP/Tdap/Td vaccine (1 - Tdap) Never done   COVID-19 Vaccine (7 - 2023-24 season) 10/11/2021   Medicare Annual Wellness Visit  04/21/2023   Mammogram  02/18/2024   Pneumonia Vaccine  Completed   Flu Shot  Completed   DEXA scan (bone density measurement)  Completed   HPV Vaccine  Aged Out   Cologuard (Stool DNA test)  Discontinued   Zoster (Shingles) Vaccine  Discontinued    Next appointment: Follow up in one year for your annual wellness visit.   Preventive Care 12 Years and Older, Female Preventive care refers to lifestyle choices and visits with your health care provider that can promote health and wellness. What does preventive care include? A yearly physical exam. This is also called an annual well check. Dental exams once or twice a year. Routine eye exams. Ask your health care provider how often you should have your eyes checked. Personal lifestyle choices, including: Daily care of your teeth and gums. Regular physical activity. Eating a healthy diet. Avoiding tobacco and drug use. Limiting alcohol use. Practicing safe sex. Taking low-dose aspirin every day. Taking vitamin and mineral supplements as recommended by your health care provider. What happens during an annual well check? The services and screenings done by your health care provider during your annual well check will depend on your age, overall health, lifestyle risk factors, and family history of disease. Counseling  Your health care provider may ask you questions about  your: Alcohol use. Tobacco use. Drug use. Emotional well-being. Home and relationship well-being. Sexual activity. Eating habits. History of falls. Memory and ability to understand (cognition). Work and work Statistician. Reproductive health. Screening  You may have the following tests or measurements: Height, weight, and BMI. Blood pressure. Lipid and cholesterol levels. These may be checked every 5 years, or more frequently if you are over 51 years old. Skin check. Lung cancer screening. You may have this screening every year starting at age 3 if you have a 30-pack-year history of smoking and currently smoke or have quit within the past 15 years. Fecal occult blood test (FOBT) of the stool. You may have this test every year starting at age 64. Flexible sigmoidoscopy or colonoscopy. You may have a sigmoidoscopy every 5 years or a colonoscopy every 10 years starting at age 26. Hepatitis C blood test. Hepatitis B blood test. Sexually transmitted disease (STD) testing. Diabetes screening. This is done by checking your blood sugar (glucose) after you have not eaten for a while (fasting). You may have this done every 1-3 years. Bone density scan. This is done to screen for osteoporosis. You may have this done starting at age 21. Mammogram. This may be done every 1-2 years. Talk to your health care provider about how often you should have regular mammograms. Talk with your health care provider about your test results, treatment options, and if necessary, the need for more tests. Vaccines  Your health care provider may recommend certain vaccines, such as: Influenza vaccine. This is recommended every  year. Tetanus, diphtheria, and acellular pertussis (Tdap, Td) vaccine. You may need a Td booster every 10 years. Zoster vaccine. You may need this after age 9. Pneumococcal 13-valent conjugate (PCV13) vaccine. One dose is recommended after age 46. Pneumococcal polysaccharide (PPSV23) vaccine.  One dose is recommended after age 58. Talk to your health care provider about which screenings and vaccines you need and how often you need them. This information is not intended to replace advice given to you by your health care provider. Make sure you discuss any questions you have with your health care provider. Document Released: 02/23/2015 Document Revised: 10/17/2015 Document Reviewed: 11/28/2014 Elsevier Interactive Patient Education  2017 [place] Prevention in the Home Falls can cause injuries. They can happen to people of all ages. There are many things you can do to make your home safe and to help prevent falls. What can I do on the outside of my home? Regularly fix the edges of walkways and driveways and fix any cracks. Remove anything that might make you trip as you walk through a door, such as a raised step or threshold. Trim any bushes or trees on the path to your home. Use bright outdoor lighting. Clear any walking paths of anything that might make someone trip, such as rocks or tools. Regularly check to see if handrails are loose or broken. Make sure that both sides of any steps have handrails. Any raised decks and porches should have guardrails on the edges. Have any leaves, snow, or ice cleared regularly. Use sand or salt on walking paths during winter. Clean up any spills in your garage right away. This includes oil or grease spills. What can I do in the bathroom? Use night lights. Install grab bars by the toilet and in the tub and shower. Do not use towel bars as grab bars. Use non-skid mats or decals in the tub or shower. If you need to sit down in the shower, use a plastic, non-slip stool. Keep the floor dry. Clean up any water that spills on the floor as soon as it happens. Remove soap buildup in the tub or shower regularly. Attach bath mats securely with double-sided non-slip rug tape. Do not have throw rugs and other things on the floor that can make  you trip. What can I do in the bedroom? Use night lights. Make sure that you have a light by your bed that is easy to reach. Do not use any sheets or blankets that are too big for your bed. They should not hang down onto the floor. Have a firm chair that has side arms. You can use this for support while you get dressed. Do not have throw rugs and other things on the floor that can make you trip. What can I do in the kitchen? Clean up any spills right away. Avoid walking on wet floors. Keep items that you use a lot in easy-to-reach places. If you need to reach something above you, use a strong step stool that has a grab bar. Keep electrical cords out of the way. Do not use floor polish or wax that makes floors slippery. If you must use wax, use non-skid floor wax. Do not have throw rugs and other things on the floor that can make you trip. What can I do with my stairs? Do not leave any items on the stairs. Make sure that there are handrails on both sides of the stairs and use them. Fix handrails that are broken or  loose. Make sure that handrails are as long as the stairways. Check any carpeting to make sure that it is firmly attached to the stairs. Fix any carpet that is loose or worn. Avoid having throw rugs at the top or bottom of the stairs. If you do have throw rugs, attach them to the floor with carpet tape. Make sure that you have a light switch at the top of the stairs and the bottom of the stairs. If you do not have them, ask someone to add them for you. What else can I do to help prevent falls? Wear shoes that: Do not have high heels. Have rubber bottoms. Are comfortable and fit you well. Are closed at the toe. Do not wear sandals. If you use a stepladder: Make sure that it is fully opened. Do not climb a closed stepladder. Make sure that both sides of the stepladder are locked into place. Ask someone to hold it for you, if possible. Clearly mark and make sure that you can  see: Any grab bars or handrails. First and last steps. Where the edge of each step is. Use tools that help you move around (mobility aids) if they are needed. These include: Canes. Walkers. Scooters. Crutches. Turn on the lights when you go into a dark area. Replace any light bulbs as soon as they burn out. Set up your furniture so you have a clear path. Avoid moving your furniture around. If any of your floors are uneven, fix them. If there are any pets around you, be aware of where they are. Review your medicines with your doctor. Some medicines can make you feel dizzy. This can increase your chance of falling. Ask your doctor what other things that you can do to help prevent falls. This information is not intended to replace advice given to you by your health care provider. Make sure you discuss any questions you have with your health care provider. Document Released: 11/23/2008 Document Revised: 07/05/2015 Document Reviewed: 03/03/2014 Elsevier Interactive Patient Education  2017 [employer].

## 2022-04-21 NOTE — Progress Notes (Signed)
Subjective:  Pt completed ADLs, Fall risk, & SDOH during e-check in on 04/14/22.  Answers verified with pt.    Virginia Parrish is a 77 y.o. female who presents for Medicare Annual (Subsequent) preventive examination.  I connected with  Virginia Parrish on 04/21/22 by a audio enabled telemedicine application and verified that I am speaking with the correct person using two identifiers.  Patient Location: Home  Provider Location: Office/Clinic  I discussed the limitations of evaluation and management by telemedicine. The patient expressed understanding and agreed to proceed.   Review of Systems     Cardiac Risk Factors include: advanced age (>94mn, >>55women);hypertension     Objective:    There were no vitals filed for this visit. There is no height or weight on file to calculate BMI.     04/21/2022    9:02 AM 02/04/2021   10:10 AM 10/23/2020    8:43 AM 10/13/2019    2:27 PM 10/04/2018   10:40 AM 08/11/2018    9:09 AM 05/20/2017    9:15 AM  Advanced Directives  Does Patient Have a Medical Advance Directive? Yes Yes Yes Yes No Yes No  Type of AParamedicof AL[place]Living will H[place]Living will Healthcare Power of AP[place] CastleLiving will     Does patient want to make changes to medical advance directive? No - Patient declined     Yes (MAU/Ambulatory/Procedural Areas - Information given)   Copy of H[place]in Chart? Yes - validated most recent copy scanned in chart (See row information)  Yes - validated most recent copy scanned in chart (See row information) No - copy requested     Would patient like information on creating a medical advance directive?     No - Patient declined  Yes (MAU/Ambulatory/Procedural Areas - Information given)    Current Medications (verified) Outpatient Encounter Medications as of 04/21/2022  Medication Sig   allopurinol (ZYLOPRIM) 300 MG tablet TAKE 1 TABLET  BY MOUTH DAILY   CALCIUM PO Take 1 tablet by mouth daily.   clotrimazole-betamethasone (LOTRISONE) cream Apply 1 application. topically as needed.   indomethacin (INDOCIN) 50 MG capsule Take 1 capsule (50 mg total) by mouth 3 (three) times daily with meals. As needed.   levothyroxine (SYNTHROID) 75 MCG tablet Take 1 tablet (75 mcg total) by mouth daily.   pantoprazole (PROTONIX) 40 MG tablet Take 1 tablet (40 mg total) by mouth daily.   spironolactone-hydrochlorothiazide (ALDACTAZIDE) 25-25 MG tablet TAKE 1 TABLET BY MOUTH DAILY   [DISCONTINUED] amoxicillin (AMOXIL) 500 MG capsule Take 4 tablets 1 hour as needed prior to dental procedures   No facility-administered encounter medications on file as of 04/21/2022.    Allergies (verified) Patient has no known allergies.   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   Past Surgical History:  Procedure Laterality Date   ABDOMINAL HYSTERECTOMY  2007   Complete due to cervical cancer   CHOLECYSTECTOMY  2003   [place] [facility] [place]   EYE SURGERY Bilateral 05/12/2018  cataract sx   HERNIA REPAIR  2014-2015   abdominal hernia with mesh   KNEE SURGERY     bilateral knee replacement   WISDOM TOOTH EXTRACTION     Family History  Problem Relation Age of Onset   Arthritis Mother    Hearing loss Mother    Hyperlipidemia Mother    Hypertension Mother    Kidney disease Mother    Arthritis Father    Heart disease Father    Drug abuse Maternal Grandmother    Arthritis Maternal Grandfather    Drug abuse Paternal Grandfather    Esophageal cancer Neg Hx    Colon cancer Neg Hx    Social History    Socioeconomic History   Marital status: Married    Spouse name: Not on file   Number of children: Not on file   Years of education: Not on file   Highest education level: Not on file  Occupational History   Occupation: Retired  Tobacco Use   Smoking status: Former    Types: Cigarettes    Quit date: 12/15/1971    Years since quitting: 50.3   Smokeless tobacco: Never   Tobacco comments:    only smoked in high school and college  Vaping Use   Vaping Use: Never used  Substance and Sexual Activity   Alcohol use: Yes    Comment: wine with dinner   Drug use: No   Sexual activity: Yes    Partners: Male  Other Topics Concern   Not on file  Social History Narrative   Not on file   Social Determinants of Health   Financial Resource Strain: Low Risk  (04/14/2022)   Overall Financial Resource Strain (CARDIA)    Difficulty of Paying Living Expenses: Not hard at all  Food Insecurity: No Food Insecurity (04/14/2022)   Hunger Vital Sign    Worried About Running Out of Food in the Last Year: Never true    [place] in the Last Year: Never true  Transportation Needs: No Transportation Needs (04/14/2022)   PRAPARE - Hydrologist (Medical): No    Lack of Transportation (Non-Medical): No  Physical Activity: Sufficiently Active (04/14/2022)   Exercise Vital Sign    Days of Exercise per Week: 6 days    Minutes of Exercise per Session: 30 min  Stress: No Stress Concern Present (04/14/2022)   [place]    Feeling of Stress : Not at all  Social Connections: [place] (04/14/2022)   Social Connection and Isolation Panel [NHANES]    Frequency of Communication with Friends and Family: Twice a week    Frequency of Social Gatherings with Friends and Family: Once a week    Attends Religious Services: More than 4 times per year    Active Member of [employer] or Organizations: Yes    Attends Programme researcher, broadcasting/film/video: More than 4 times per year    Marital Status: Married    Tobacco Counseling Counseling given: Not Answered Tobacco comments: only smoked in high school and college   Clinical Intake:  Pre-visit preparation completed: Yes  Pain : No/denies pain  Diabetes: No  How often do you need to have someone help you when you read instructions, pamphlets, or other written materials from your doctor or pharmacy?: 1 - Never   Activities of Daily Living    04/14/2022    9:19 AM  In your present state of  health, do you have any difficulty performing the following activities:  Hearing? 0  Vision? 0  Difficulty concentrating or making decisions? 0  Walking or climbing stairs? 0  Dressing or bathing? 0  Doing errands, shopping? 0  Preparing Food and eating ? N  Using the Toilet? N  In the past six months, have you accidently leaked urine? N  Do you have problems with loss of bowel control? N  Managing your Medications? N  Managing your Finances? N  Housekeeping or managing your Housekeeping? N    Patient Care Team: Marrian Salvage, FNP as PCP - General (Internal Medicine)  Indicate any recent Medical Services you may have received from other than [facility] providers in the past year (date may be approximate).     Assessment:   This is a routine wellness examination for [place].  Hearing/Vision screen No results found.  Dietary issues and exercise activities discussed: Current Exercise Habits: Home exercise routine, Type of exercise: treadmill, Time (Minutes): 30, Frequency (Times/Week): 6, Weekly Exercise (Minutes/Week): 180, Intensity: Moderate, Exercise limited by: None identified   Goals Addressed   None    Depression Screen    04/21/2022    9:05 AM 01/10/2022   10:17 AM 11/06/2020    1:56 PM 10/23/2020    8:56 AM 10/13/2019    2:29 PM 08/11/2018    9:13 AM 03/03/2018    9:59 AM  PHQ 2/9 Scores  PHQ - 2 Score 0 0 0 0 0 0 0    Fall Risk    04/14/2022     9:19 AM 01/10/2022   10:17 AM 11/06/2020    1:56 PM 10/23/2020    8:46 AM 10/13/2019    2:29 PM  Fall Risk   Falls in the past year? 1 0 0 0 0  Number falls in past yr: 0 0 0 0 0  Injury with Fall? 0 0 0 0 0  Risk for fall due to : No Fall Risks No Fall Risks No Fall Risks    Follow up Falls evaluation completed Falls evaluation completed Falls evaluation completed Falls evaluation completed;Education provided;Falls prevention discussed Falls prevention discussed    FALL RISK PREVENTION PERTAINING TO THE HOME:  Any stairs in or around the home? No  Home free of loose throw rugs in walkways, pet beds, electrical cords, etc? Yes  Adequate lighting in your home to reduce risk of falls? Yes   ASSISTIVE DEVICES UTILIZED TO PREVENT FALLS:  Life alert? No  Use of a cane, walker or w/c? No  Grab bars in the bathroom? Yes  Shower chair or bench in shower? No  Elevated toilet seat or a handicapped toilet?  Comfort height  TIMED UP AND GO:  Was the test performed?  No, audio visit .   Cognitive Function:    05/20/2017    9:39 AM  MMSE - Mini Mental State Exam  Orientation to time 5  Orientation to Place 5  Registration 3  Attention/ Calculation 5  Recall 3  Language- name 2 objects 2  Language- repeat 1  Language- follow 3 step command 3  Language- read & follow direction 1  Write a sentence 1  Copy design 1  Total score 30        04/21/2022    9:08 AM  6CIT Screen  What Year? 0 points  What month? 0 points  What time? 0 points  Count back from 20 0 points  Months in reverse 0 points  Repeat  phrase 0 points  Total Score 0 points    Immunizations Immunization History  Administered Date(s) Administered   Fluad Quad(high Dose 65+) 10/26/2018   Influenza Split 12/15/2021   Influenza, High Dose Seasonal PF 10/28/2016, 10/16/2017   Influenza-Unspecified 11/09/2019, 11/24/2020   Moderna SARS-COV2 Booster Vaccination 11/19/2020   Moderna Sars-Covid-2 Vaccination  03/27/2019, 04/25/2019, 12/09/2019, 05/23/2020   PFIZER(Purple Top)SARS-COV-2 Vaccination 09/29/2019   Pneumococcal Conjugate-13 03/03/2018   Pneumococcal Polysaccharide-23 12/08/2019   RSV,unspecified 12/15/2021   Zoster Recombinat (Shingrix) 10/09/2019    TDAP status: Due, Education has been provided regarding the importance of this vaccine. Advised may receive this vaccine at local pharmacy or Health Dept. Aware to provide a copy of the vaccination record if obtained from local pharmacy or Health Dept. Verbalized acceptance and understanding.  Flu Vaccine status: Up to date  Pneumococcal vaccine status: Up to date  Covid-19 vaccine status: Information provided on how to obtain vaccines.   Qualifies for Shingles Vaccine? Yes   Zostavax completed No   Shingrix Completed?: No.    Education has been provided regarding the importance of this vaccine. Patient has been advised to call insurance company to determine out of pocket expense if they have not yet received this vaccine. Advised may also receive vaccine at local pharmacy or Health Dept. Verbalized acceptance and understanding.  Screening Tests Health Maintenance  Topic Date Due   Hepatitis C Screening  Never done   DTaP/Tdap/Td (1 - Tdap) Never done   COVID-19 Vaccine (7 - 2023-24 season) 10/11/2021   Medicare Annual Wellness (AWV)  10/23/2021   MAMMOGRAM  02/18/2024   Pneumonia Vaccine 77+ Years old  Completed   INFLUENZA VACCINE  Completed   DEXA SCAN  Completed   HPV VACCINES  Aged Out   Fecal DNA (Cologuard)  Discontinued   Zoster Vaccines- Shingrix  Discontinued    Health Maintenance  Health Maintenance Due  Topic Date Due   Hepatitis C Screening  Never done   DTaP/Tdap/Td (1 - Tdap) Never done   COVID-19 Vaccine (7 - 2023-24 season) 10/11/2021   Medicare Annual Wellness (AWV)  10/23/2021    Colorectal cancer screening: Type of screening: Cologuard. Completed 08/13/17. Repeat every N/a years  Mammogram status:  Completed 02/17/22. Repeat every year  Bone Density status: Completed 12/03/20. Results reflect: Bone density results: OSTEOPENIA. Repeat every 2 years.  Lung Cancer Screening: (Low Dose CT Chest recommended if Age 79-80 years, 30 pack-year currently smoking OR have quit w/in 15years.) does not qualify.   Additional Screening:  Hepatitis C Screening: does qualify; Completed N/a  Vision Screening: Recommended annual ophthalmology exams for early detection of glaucoma and other disorders of the eye. Is the patient up to date with their annual eye exam?  Yes  Who is the provider or what is the name of the office in which the patient attends annual eye exams? Dr.Tanner If pt is not established with a provider, would they like to be referred to a provider to establish care? No .   Dental Screening: Recommended annual dental exams for proper oral hygiene  Community Resource Referral / Chronic Care Management: CRR required this visit?  No   CCM required this visit?  No      Plan:     I have personally reviewed and noted the following in the patient's chart:   Medical and social history Use of alcohol, tobacco or illicit drugs  Current medications and supplements including opioid prescriptions. Patient is not currently taking opioid prescriptions. Functional  ability and status Nutritional status Physical activity Advanced directives List of other physicians Hospitalizations, surgeries, and ER visits in previous 12 months Vitals Screenings to include cognitive, depression, and falls Referrals and appointments  In addition, I have reviewed and discussed with patient certain preventive protocols, quality metrics, and best practice recommendations. A written personalized care plan for preventive services as well as general preventive health recommendations were provided to patient.   Due to this being a telephonic visit, the after visit summary with patients personalized plan was  offered to patient via mail or my-chart.  Patient would like to access on my-chart.  Beatris Ship, [place]   04/21/2022   Nurse Notes: None

## 2022-07-15 ENCOUNTER — Ambulatory Visit (INDEPENDENT_AMBULATORY_CARE_PROVIDER_SITE_OTHER): Payer: Medicare HMO | Admitting: Family

## 2022-07-15 ENCOUNTER — Encounter: Payer: Self-pay | Admitting: Family

## 2022-07-15 VITALS — BP 134/78 | HR 67 | Temp 97.5°F | Ht 64.0 in | Wt 224.6 lb

## 2022-07-15 DIAGNOSIS — Z0000 Encounter for general adult medical examination without abnormal findings: Secondary | ICD-10-CM | POA: Diagnosis not present

## 2022-07-15 DIAGNOSIS — Z13220 Encounter for screening for lipoid disorders: Secondary | ICD-10-CM | POA: Diagnosis not present

## 2022-07-15 DIAGNOSIS — R7309 Other abnormal glucose: Secondary | ICD-10-CM

## 2022-07-15 DIAGNOSIS — M1A9XX1 Chronic gout, unspecified, with tophus (tophi): Secondary | ICD-10-CM | POA: Diagnosis not present

## 2022-07-15 LAB — COMPREHENSIVE METABOLIC PANEL
ALT: 12 U/L (ref 0–35)
AST: 17 U/L (ref 0–37)
Albumin: 4.4 g/dL (ref 3.5–5.2)
Alkaline Phosphatase: 70 U/L (ref 39–117)
BUN: 20 mg/dL (ref 6–23)
CO2: 31 mEq/L (ref 19–32)
Calcium: 9.8 mg/dL (ref 8.4–10.5)
Chloride: 102 mEq/L (ref 96–112)
Creatinine, Ser: 0.83 mg/dL (ref 0.40–1.20)
GFR: 69.2 mL/min (ref >60.00)
Glucose, Bld: 110 mg/dL — ABNORMAL HIGH (ref 70–99)
Potassium: 4.6 mEq/L (ref 3.5–5.1)
Sodium: 143 mEq/L (ref 135–145)
Total Bilirubin: 0.7 mg/dL (ref 0.2–1.2)
Total Protein: 6.6 g/dL (ref 6.0–8.3)

## 2022-07-15 LAB — CBC WITH DIFFERENTIAL/PLATELET
Basophils Absolute: 0.1 10*3/uL (ref 0.0–0.1)
Basophils Relative: 0.8 % (ref 0.0–3.0)
Eosinophils Absolute: 0.2 10*3/uL (ref 0.0–0.7)
Eosinophils Relative: 2.8 % (ref 0.0–5.0)
HCT: 43.6 % (ref 36.0–46.0)
Hemoglobin: 14.3 g/dL (ref 12.0–15.0)
Lymphocytes Relative: 32.5 % (ref 12.0–46.0)
Lymphs Abs: 2.1 10*3/uL (ref 0.7–4.0)
MCHC: 32.7 g/dL (ref 30.0–36.0)
MCV: 92.1 fl (ref 78.0–100.0)
Monocytes Absolute: 0.5 10*3/uL (ref 0.1–1.0)
Monocytes Relative: 7.9 % (ref 3.0–12.0)
Neutro Abs: 3.7 10*3/uL (ref 1.4–7.7)
Neutrophils Relative %: 56 % (ref 43.0–77.0)
Platelets: 262 10*3/uL (ref 150.0–400.0)
RBC: 4.74 Mil/uL (ref 3.87–5.11)
RDW: 14.8 % (ref 11.5–15.5)
WBC: 6.6 10*3/uL (ref 4.0–10.5)

## 2022-07-15 LAB — LIPID PANEL
Cholesterol: 197 mg/dL (ref 0–200)
HDL: 72.3 mg/dL (ref >39.00)
LDL Cholesterol: 106 mg/dL — ABNORMAL HIGH (ref 0–99)
NonHDL: 124.33
Total CHOL/HDL Ratio: 3
Triglycerides: 93 mg/dL (ref 0.0–149.0)
VLDL: 18.6 mg/dL (ref 0.0–40.0)

## 2022-07-15 LAB — HEMOGLOBIN A1C: Hgb A1c MFr Bld: 5.5 % (ref 4.6–6.5)

## 2022-07-15 LAB — URIC ACID: Uric Acid, Serum: 4.4 mg/dL (ref 2.4–7.0)

## 2022-07-15 LAB — TSH: TSH: 0.82 u[IU]/mL (ref 0.35–5.50)

## 2022-07-15 NOTE — Progress Notes (Signed)
Virginia Parrish is a 77 y.o. 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     clotrimazole-betamethasone (LOTRISONE) cream Apply 1 application. topically as needed. 45 g 0   indomethacin (INDOCIN) 50 MG capsule Take 1 capsule (50 mg total) by mouth 3 (three) times daily with meals. As needed. 90 capsule 1   levothyroxine (SYNTHROID) 75 MCG tablet Take 1 tablet (75 mcg total) by mouth daily. 90 tablet 3   pantoprazole (PROTONIX) 40 MG tablet Take 1 tablet (40 mg total) by mouth daily. 90 tablet 3   spironolactone-hydrochlorothiazide (ALDACTAZIDE) 25-25 MG tablet TAKE 1 TABLET BY MOUTH DAILY 90 tablet 3   No current facility-administered medications for this visit.    Allergies: Patient has no known allergies.  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    Past Surgical History:  Procedure Laterality Date   ABDOMINAL HYSTERECTOMY  2007   Complete due to cervical cancer   CHOLECYSTECTOMY  2003   COLONOSCOPY     around 2011 in [place] cologuard around 2016   EYE SURGERY Bilateral  05/12/2018   cataract sx   HERNIA REPAIR  2014-2015   abdominal hernia with mesh   KNEE SURGERY     bilateral knee replacement   WISDOM TOOTH EXTRACTION      Family History  Problem Relation Age of Onset   Arthritis Mother    Hearing loss Mother    Hyperlipidemia Mother    Hypertension Mother    Kidney disease Mother    Arthritis Father    Heart disease Father    Drug abuse Maternal Grandmother    Arthritis Maternal Grandfather    Drug abuse Paternal Grandfather    Esophageal cancer Neg Hx    Colon cancer Neg Hx     Social History   Tobacco Use   Smoking status: Former    Types: Cigarettes    Quit date: 12/15/1971    Years since quitting: 50.6   Smokeless tobacco: Never   Tobacco comments:    only smoked in high school and college  Substance Use Topics   Alcohol use: Yes    Comment: wine with dinner    Subjective:   Presents for yearly CPE; no acute concerns today; 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   HENT: Negative.    Eyes: Negative.  Respiratory: Negative.    Cardiovascular: Negative.   Gastrointestinal: Negative.   Genitourinary: Negative.   Musculoskeletal: Negative.   Skin: Negative.   Neurological: Negative.   Endo/Heme/Allergies: Negative.   Psychiatric/Behavioral: Negative.        Objective:  Vitals:   07/15/22 0924  BP: 134/78  Pulse: 67  Temp: (!) 97.5 F (36.4 C)  TempSrc: Oral  SpO2: 98%  Weight: 224 lb 9.6 oz (101.9 kg)  Height: 5\' 4"  (1.626 m)    General: Well developed, well nourished, in no acute distress  Skin : Warm and dry.  Head: Normocephalic and atraumatic  Eyes: Sclera and conjunctiva clear; pupils round and reactive to light; extraocular movements intact  Ears: External normal; canals clear; tympanic membranes normal  Oropharynx:  Pink, supple. No suspicious lesions  Neck: Supple without thyromegaly, adenopathy  Lungs: Respirations unlabored; clear to auscultation bilaterally without wheeze, rales, rhonchi  CVS exam: normal rate and regular rhythm.  Abdomen: Soft; nontender; nondistended; normoactive bowel sounds; no masses or hepatosplenomegaly  Musculoskeletal: No deformities; no active joint inflammation  Extremities: No edema, cyanosis, clubbing  Vessels: Symmetric bilaterally  Neurologic: Alert and oriented; speech intact; face symmetrical; moves all extremities well; CNII-XII intact without focal deficit   Assessment:  1. PE (physical exam), annual   2. Lipid screening   3. Elevated glucose   4. 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  Orders Placed This Encounter  Procedures   CBC with Differential/Platelet   Comp Met (CMET)   Lipid panel   TSH   Hemoglobin A1c   Uric acid    Requested Prescriptions    No prescriptions requested or ordered in this encounter

## 2022-09-03 ENCOUNTER — Ambulatory Visit
Admission: RE | Admit: 2022-09-03 | Discharge: 2022-09-03 | Disposition: A | Discharge status: Home / Self Care | Payer: Medicare HMO | Source: Ambulatory Visit | Attending: Family | Admitting: Family

## 2022-09-03 ENCOUNTER — Other Ambulatory Visit: Payer: Self-pay | Admitting: Family

## 2022-09-03 DIAGNOSIS — N6320 Unspecified lump in the left breast, unspecified quadrant: Secondary | ICD-10-CM

## 2022-09-03 DIAGNOSIS — N6325 Unspecified lump in the left breast, overlapping quadrants: Secondary | ICD-10-CM | POA: Diagnosis not present

## 2022-10-21 DIAGNOSIS — H04123 Dry eye syndrome of bilateral lacrimal glands: Secondary | ICD-10-CM | POA: Diagnosis not present

## 2022-10-21 DIAGNOSIS — H26491 Other secondary cataract, right eye: Secondary | ICD-10-CM | POA: Diagnosis not present

## 2022-10-21 DIAGNOSIS — H0100A Unspecified blepharitis right eye, upper and lower eyelids: Secondary | ICD-10-CM | POA: Diagnosis not present

## 2022-10-21 DIAGNOSIS — H43813 Vitreous degeneration, bilateral: Secondary | ICD-10-CM | POA: Diagnosis not present

## 2022-10-21 DIAGNOSIS — H0100B Unspecified blepharitis left eye, upper and lower eyelids: Secondary | ICD-10-CM | POA: Diagnosis not present

## 2022-10-21 DIAGNOSIS — H5213 Myopia, bilateral: Secondary | ICD-10-CM | POA: Diagnosis not present

## 2022-10-21 DIAGNOSIS — H524 Presbyopia: Secondary | ICD-10-CM | POA: Diagnosis not present

## 2023-02-24 ENCOUNTER — Other Ambulatory Visit: Payer: Self-pay | Admitting: Family

## 2023-03-09 ENCOUNTER — Ambulatory Visit
Admission: RE | Admit: 2023-03-09 | Discharge: 2023-03-09 | Disposition: A | Discharge status: Home / Self Care | Payer: Medicare HMO | Source: Ambulatory Visit | Attending: Family | Admitting: Family

## 2023-03-09 ENCOUNTER — Ambulatory Visit
Admission: RE | Admit: 2023-03-09 | Discharge: 2023-03-09 | Disposition: A | Discharge status: Home / Self Care | Payer: Medicare Other | Source: Ambulatory Visit | Attending: Family | Admitting: Family

## 2023-03-09 DIAGNOSIS — N6320 Unspecified lump in the left breast, unspecified quadrant: Secondary | ICD-10-CM

## 2023-03-09 DIAGNOSIS — N6325 Unspecified lump in the left breast, overlapping quadrants: Secondary | ICD-10-CM | POA: Diagnosis not present

## 2023-03-19 ENCOUNTER — Telehealth: Payer: Self-pay | Admitting: Family

## 2023-03-19 NOTE — Telephone Encounter (Signed)
 Copied from CRM [id number]. Topic: Medicare AWV >> Mar 19, 2023  3:11 PM Nathanel DEL wrote: Reason for CRM: Called LVM 03/18/2023 to schedule AWV. Please schedule Virtual or Telehealth visits ONLY.   Nathanel Paschal; Care Guide Ambulatory Clinical Support [facility] Direct Dial: [phone]

## 2023-04-23 ENCOUNTER — Ambulatory Visit: Payer: Medicare Other

## 2023-04-23 VITALS — Ht 64.0 in | Wt 224.0 lb

## 2023-04-23 DIAGNOSIS — Z0000 Encounter for general adult medical examination without abnormal findings: Secondary | ICD-10-CM

## 2023-04-23 NOTE — Progress Notes (Addendum)
 Subjective:   Virginia Parrish is a 78 y.o. who presents for a Medicare Wellness preventive visit.  Visit Complete: Virtual I connected with  Hyman Bower Forse on 04/23/23 by a audio enabled telemedicine application and verified that I am speaking with the correct person using two identifiers.  Patient Location: Home  Provider Location: Home Office  I discussed the limitations of evaluation and management by telemedicine. The patient expressed understanding and agreed to proceed.  Vital Signs: Because this visit was a virtual/telehealth visit, some criteria may be missing or patient reported. Any vitals not documented were not able to be obtained and vitals that have been documented are patient reported.  VideoDeclined- This patient declined Librarian, academic. Therefore the visit was completed with audio only.  Persons Participating in Visit: Patient.  AWV Questionnaire: Yes: Patient Medicare AWV questionnaire was completed by the patient on 04/20/23; I have confirmed that all information answered by patient is correct and no changes since this date.  Cardiac Risk Factors include: advanced age (>69men, >6 women);hypertension     Objective:    Today's Vitals   04/23/23 1350  Weight: 224 lb (101.6 kg)  Height: 5\' 4"  (1.626 m)   Body mass index is 38.45 kg/m.     04/23/2023    1:55 PM 04/21/2022    9:02 AM 02/04/2021   10:10 AM 10/23/2020    8:43 AM 10/13/2019    2:27 PM 10/04/2018   10:40 AM 08/11/2018    9:09 AM  Advanced Directives  Does Patient Have a Medical Advance Directive? Yes Yes Yes Yes Yes No Yes  Type of Estate agent of [place];Living will Healthcare Power of [place];Living will Healthcare Power of [place];Living will Healthcare Power of [employer] of [place];Living will    Does patient want to make changes to medical advance directive?  No - Patient declined     Yes (MAU/Ambulatory/Procedural  Areas - Information given)  Copy of Healthcare Power of Attorney in Chart? No - copy requested Yes - validated most recent copy scanned in chart (See row information)  Yes - validated most recent copy scanned in chart (See row information) No - copy requested    Would patient like information on creating a medical advance directive?      No - Patient declined     Current Medications (verified) Outpatient Encounter Medications as of 04/23/2023  Medication Sig   allopurinol (ZYLOPRIM) 300 MG tablet TAKE 1 TABLET BY MOUTH DAILY   CALCIUM PO Take 1 tablet by mouth daily.   clotrimazole-betamethasone (LOTRISONE) cream Apply 1 application. topically as needed.   indomethacin (INDOCIN) 50 MG capsule Take 1 capsule (50 mg total) by mouth 3 (three) times daily with meals. As needed.   levothyroxine (SYNTHROID) 75 MCG tablet TAKE 1 TABLET BY MOUTH DAILY   pantoprazole (PROTONIX) 40 MG tablet TAKE 1 TABLET BY MOUTH DAILY   spironolactone-hydrochlorothiazide (ALDACTAZIDE) 25-25 MG tablet TAKE 1 TABLET BY MOUTH DAILY   No facility-administered encounter medications on file as of 04/23/2023.    Allergies (verified) Patient has no known allergies.   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   Past Surgical History:  Procedure Laterality Date   ABDOMINAL HYSTERECTOMY  2007   Complete due to cervical cancer   CHOLECYSTECTOMY  2003   COLONOSCOPY     around 2011 in [place] cologuard around 2016   EYE SURGERY Bilateral 05/12/2018   cataract sx   HERNIA REPAIR  2014-2015   abdominal hernia with mesh   KNEE SURGERY     bilateral knee replacement   WISDOM TOOTH EXTRACTION     Family History   Problem Relation Age of Onset   Arthritis Mother    Hearing loss Mother    Hyperlipidemia Mother    Hypertension Mother    Kidney disease Mother    Arthritis Father    Heart disease Father    Drug abuse Maternal Grandmother    Arthritis Maternal Grandfather    Drug abuse Paternal Grandfather    Esophageal cancer Neg Hx    Colon cancer Neg Hx    Social History   Socioeconomic History   Marital status: Married    Spouse name: Not on file   Number of children: Not on file   Years of education: Not on file   Highest education level: Professional school degree (e.g., MD, DDS, DVM, JD)  Occupational History   Occupation: Retired  Tobacco Use   Smoking status: Former    Current packs/day: 0.00    Types: Cigarettes    Quit date: 12/15/1971    Years since quitting: 51.3   Smokeless tobacco: Never   Tobacco comments:    only smoked in high school and college  Vaping Use   Vaping status: Never Used  Substance and Sexual Activity   Alcohol use: Yes    Comment: wine with dinner   Drug use: No   Sexual activity: Yes    Partners: Male  Other Topics Concern   Not on file  Social History Narrative   Not on file   Social Drivers of Health   Financial Resource Strain: Low Risk  (04/23/2023)   Overall Financial Resource Strain (CARDIA)    Difficulty of Paying Living Expenses: Not very hard  Food Insecurity: No Food Insecurity (04/23/2023)   Hunger Vital Sign    Worried About Running Out of Food in the Last Year: Never true    Ran Out of Food in the Last Year: Never true  Transportation Needs: No Transportation Needs (04/23/2023)   PRAPARE - Administrator, Civil Service (Medical): No    Lack of Transportation (Non-Medical): No  Physical Activity: Sufficiently Active (04/23/2023)   Exercise Vital Sign    Days of Exercise per Week: 6 days    Minutes of Exercise per Session: 30 min  Stress: No Stress Concern Present (04/23/2023)   [employer] of Occupational  Health - Occupational Stress Questionnaire    Feeling of Stress : Only a little  Social Connections: Moderately Integrated (04/23/2023)   Social Connection and Isolation Panel [NHANES]    Frequency of Communication with Friends and Family: Once a week    Frequency of Social Gatherings with Friends and Family: Once a week    Attends Religious Services: More than 4 times per year    Active Member of [employer] or Organizations: Yes    Attends Engineer, structural: More than 4 times per year    Marital Status: Married    Tobacco Counseling Counseling given: Not Answered Tobacco comments: only smoked in high school and college  Clinical Intake:  Pre-visit preparation completed: Yes  Pain : No/denies pain     BMI - recorded: 38.45 Nutritional Status: BMI > 30  Obese Nutritional Risks: None Diabetes: No  How often do you need to have someone help you when you read instructions, pamphlets, or other written materials from your doctor or pharmacy?: 1 - Never  Interpreter Needed?: No  Information entered by :: Theresa Mulligan LPN   Activities of Daily Living     04/23/2023    1:54 PM 04/20/2023   10:14 AM  In your present state of health, do you have any difficulty performing the following activities:  Hearing? 0 0  Vision? 0 0  Difficulty concentrating or making decisions? 0 0  Walking or climbing stairs? 0 0  Dressing or bathing? 0 0  Doing errands, shopping? 0 0  Preparing Food and eating ? N N  Using the Toilet? N N  In the past six months, have you accidently leaked urine? N N  Do you have problems with loss of bowel control? N N  Managing your Medications? N N  Managing your Finances? N N  Housekeeping or managing your Housekeeping? N N    Patient Care Team: Olive Bass, FNP as PCP - General (Internal Medicine)  Indicate any recent Medical Services you may have received from other than [facility] providers in the past year (date may be  approximate).     Assessment:   This is a routine wellness examination for Cerena.  Hearing/Vision screen Hearing Screening - Comments:: Denies hearing difficulties   Vision Screening - Comments:: Wears rx glasses - up to date with routine eye exams with  [facility]   Goals Addressed               This Visit's Progress     Increase physical activity (pt-stated)        Remain active       Depression Screen     04/23/2023    2:02 PM 04/21/2022    9:05 AM 01/10/2022   10:17 AM 11/06/2020    1:56 PM 10/23/2020    8:56 AM 10/13/2019    2:29 PM 08/11/2018    9:13 AM  PHQ 2/9 Scores  PHQ - 2 Score 0 0 0 0 0 0 0    Fall Risk     04/23/2023    1:55 PM 04/20/2023   10:14 AM 04/14/2022    9:19 AM 01/10/2022   10:17 AM 11/06/2020    1:56 PM  Fall Risk   Falls in the past year? 0 0 1 0 0  Number falls in past yr: 0 0 0 0 0  Injury with Fall? 0 0 0 0 0  Risk for fall due to : No Fall Risks  No Fall Risks No Fall Risks No Fall Risks  Follow up Falls prevention discussed;Falls evaluation completed  Falls evaluation completed Falls evaluation completed Falls evaluation completed    MEDICARE RISK AT HOME:  Medicare Risk at Home Any stairs in or around the home?: No If so, are there any without handrails?: No Home free of loose throw rugs in walkways, pet beds, electrical cords, etc?: Yes Adequate lighting in your home to reduce risk of falls?: Yes Life alert?: No Use of a cane, walker or w/c?: No Grab bars in the bathroom?: Yes Shower chair or bench in shower?: No Elevated toilet seat or a handicapped toilet?: Yes  TIMED UP AND GO:  Was the test  performed?  No  Cognitive Function: 6CIT completed    05/20/2017    9:39 AM  MMSE - Mini Mental State Exam  Orientation to time 5  Orientation to Place 5  Registration 3  Attention/ Calculation 5  Recall 3  Language- name 2 objects 2  Language- repeat 1  Language- follow 3 step command 3  Language- read & follow direction  1  Write a sentence 1  Copy design 1  Total score 30        04/23/2023    1:56 PM 04/21/2022    9:08 AM  6CIT Screen  What Year? 0 points 0 points  What month? 0 points 0 points  What time? 0 points 0 points  Count back from 20 0 points 0 points  Months in reverse 0 points 0 points  Repeat phrase 0 points 0 points  Total Score 0 points 0 points    Immunizations Immunization History  Administered Date(s) Administered   Fluad Quad(high Dose 65+) 10/26/2018   Influenza Split 12/15/2021   Influenza, High Dose Seasonal PF 10/28/2016, 10/16/2017   Influenza-Unspecified 11/09/2019, 11/24/2020, 10/12/2022   Moderna SARS-COV2 Booster Vaccination 11/19/2020   Moderna Sars-Covid-2 Vaccination 03/27/2019, 04/25/2019, 12/09/2019, 05/23/2020   PFIZER(Purple Top)SARS-COV-2 Vaccination 09/29/2019   Pneumococcal Conjugate-13 03/03/2018   Pneumococcal Polysaccharide-23 12/08/2019   RSV,unspecified 12/15/2021   Zoster Recombinant(Shingrix) 10/09/2019    Screening Tests Health Maintenance  Topic Date Due   Hepatitis C Screening  Never done   DTaP/Tdap/Td (1 - Tdap) Never done   COVID-19 Vaccine (7 - 2024-25 season) 10/12/2022   Medicare Annual Wellness (AWV)  04/22/2024   Pneumonia Vaccine 12+ Years old  Completed   INFLUENZA VACCINE  Completed   DEXA SCAN  Completed   HPV VACCINES  Aged Out   Fecal DNA (Cologuard)  Discontinued   Zoster Vaccines- Shingrix  Discontinued    Health Maintenance  Health Maintenance Due  Topic Date Due   Hepatitis C Screening  Never done   DTaP/Tdap/Td (1 - Tdap) Never done   COVID-19 Vaccine (7 - 2024-25 season) 10/12/2022   Health Maintenance Items Addressed: Hepatitis C Screening, Deferred  Additional Screening:  Vision Screening: Recommended annual ophthalmology exams for early detection of glaucoma and other disorders of the eye.  Dental Screening: Recommended annual dental exams for proper oral hygiene  Community Resource Referral /  Chronic Care Management: CRR required this visit?  No   CCM required this visit?  No     Plan:     I have personally reviewed and noted the following in the patient's chart:   Medical and social history Use of alcohol, tobacco or illicit drugs  Current medications and supplements including opioid prescriptions. Patient is not currently taking opioid prescriptions. Functional ability and status Nutritional status Physical activity Advanced directives List of other physicians Hospitalizations, surgeries, and ER visits in previous 12 months Vitals Screenings to include cognitive, depression, and falls Referrals and appointments  In addition, I have reviewed and discussed with patient certain preventive protocols, quality metrics, and best practice recommendations. A written personalized care plan for preventive services as well as general preventive health recommendations were provided to patient.     Tillie Rung, LPN   05/19/8117   After Visit Summary: (MyChart) Due to this being a telephonic visit, the after visit summary with patients personalized plan was offered to patient via MyChart   Notes: Nothing significant to report at this time.

## 2023-04-23 NOTE — Patient Instructions (Addendum)
 Ms. Virginia Parrish , Thank you for taking time to come for your Medicare Wellness Visit. I appreciate your ongoing commitment to your health goals. Please review the following plan we discussed and let me know if I can assist you in the future.   Referrals/Orders/Follow-Ups/Clinician Recommendations:   This is a list of the screening recommended for you and due dates:  Health Maintenance  Topic Date Due   Hepatitis C Screening  Never done   DTaP/Tdap/Td vaccine (1 - Tdap) Never done   COVID-19 Vaccine (7 - 2024-25 season) 10/12/2022   Medicare Annual Wellness Visit  04/22/2024   Pneumonia Vaccine  Completed   Flu Shot  Completed   DEXA scan (bone density measurement)  Completed   HPV Vaccine  Aged Out   Cologuard (Stool DNA test)  Discontinued   Zoster (Shingles) Vaccine  Discontinued    Advanced directives: (Copy Requested) Please bring a copy of your health care power of attorney and living will to the office to be added to your chart at your convenience. You can mail to [facility] [address] [place], [place] [address] or email to [email or web address]  Next Medicare Annual Wellness Visit scheduled for next year: Yes

## 2023-05-23 ENCOUNTER — Other Ambulatory Visit: Payer: Self-pay | Admitting: Family

## 2023-05-23 DIAGNOSIS — M109 Gout, unspecified: Secondary | ICD-10-CM

## 2023-06-15 DIAGNOSIS — K080 Exfoliation of teeth due to systemic causes: Secondary | ICD-10-CM | POA: Diagnosis not present

## 2023-06-16 DIAGNOSIS — K080 Exfoliation of teeth due to systemic causes: Secondary | ICD-10-CM | POA: Diagnosis not present

## 2023-07-13 DIAGNOSIS — K080 Exfoliation of teeth due to systemic causes: Secondary | ICD-10-CM | POA: Diagnosis not present

## 2023-08-21 ENCOUNTER — Other Ambulatory Visit: Payer: Self-pay | Admitting: Family

## 2023-08-21 DIAGNOSIS — M109 Gout, unspecified: Secondary | ICD-10-CM

## 2023-10-08 DIAGNOSIS — K080 Exfoliation of teeth due to systemic causes: Secondary | ICD-10-CM | POA: Diagnosis not present

## 2023-10-22 DIAGNOSIS — H04123 Dry eye syndrome of bilateral lacrimal glands: Secondary | ICD-10-CM | POA: Diagnosis not present

## 2023-10-22 DIAGNOSIS — H43813 Vitreous degeneration, bilateral: Secondary | ICD-10-CM | POA: Diagnosis not present

## 2023-10-22 DIAGNOSIS — H26491 Other secondary cataract, right eye: Secondary | ICD-10-CM | POA: Diagnosis not present

## 2023-11-03 ENCOUNTER — Ambulatory Visit (INDEPENDENT_AMBULATORY_CARE_PROVIDER_SITE_OTHER): Admitting: Medical

## 2023-11-03 ENCOUNTER — Ambulatory Visit: Payer: Self-pay

## 2023-11-03 VITALS — BP 137/80 | HR 69 | Temp 97.8°F | Resp 16 | Ht 64.0 in | Wt 228.2 lb

## 2023-11-03 DIAGNOSIS — Z23 Encounter for immunization: Secondary | ICD-10-CM | POA: Diagnosis not present

## 2023-11-03 DIAGNOSIS — I10 Essential (primary) hypertension: Secondary | ICD-10-CM

## 2023-11-03 DIAGNOSIS — L309 Dermatitis, unspecified: Secondary | ICD-10-CM

## 2023-11-03 DIAGNOSIS — L853 Xerosis cutis: Secondary | ICD-10-CM | POA: Diagnosis not present

## 2023-11-03 DIAGNOSIS — R739 Hyperglycemia, unspecified: Secondary | ICD-10-CM

## 2023-11-03 MED ORDER — TRIAMCINOLONE ACETONIDE 0.1 % EX CREA: 1.0000 | TOPICAL_CREAM | Freq: Two times a day (BID) | CUTANEOUS | 0 refills | Status: DC

## 2023-11-03 NOTE — Patient Instructions (Addendum)
 Localized dermatitis of right calf Suspected allergic dermatitis with dry, flaky, itchy skin. Previous treatments provided temporary relief. - Prescribed triamcinolone  cream twice daily. - Advised moisturizing once daily with Palmer's lotion containing vitamin E. - Instructed to monitor for changes such as redness, warmth, tenderness, or induration and report if these occur. - Scheduled follow-up in 7-10 days to reassess. - Consider referral to dermatologist if no improvement or worsening.  Essential hypertension Long-standing hypertension managed with aldactazide. Current regimen includes two diuretics, uncommon in contemporary treatment. Monitoring kidney function and blood pressure is necessary. - Check blood pressure during the visit.  Elevated sugar - Order metabolic panel and A1c to assess kidney function and blood sugar levels. - Consider bp medication adjustment based on lab results and blood pressure readings.  Follow up 7 -10 days or as needed

## 2023-11-03 NOTE — Telephone Encounter (Signed)
 FYI Only or Action Required?: FYI only for provider.  Patient was last seen in primary care on 07/15/2022 by Jason Leita Repine, FNP.  Called Nurse Triage reporting Rash.  Symptoms began about a month ago.  Interventions attempted: OTC medications: anti-itch lotions and creams without relief.  Symptoms are: gradually worsening.  Triage Disposition: See PCP When Office is Open (Within 3 Days)  Patient/caregiver understands and will follow disposition?: Yes     Copied from CRM [id number]. Topic: Clinical - Red Word Triage >> Nov 03, 2023  8:09 AM [place] A wrote: [facility] that prompted transfer to Nurse Triage: Patient has rash on the right leg on her shin. The area is spreading it is itchy, inflamed and red. Patient said this has been for a month now . Reason for Disposition  Mild widespread rash  (Exception: Heat rash lasting 3 days or less.)  Answer Assessment - Initial Assessment Questions 1. APPEARANCE of RASH: What does the rash look like? (e.g., blisters, dry flaky skin, red spots, redness, sores)     Started with Dry patch, redness, raised, 2. SIZE: How big are the spots? (e.g., tip of pen, eraser, coin; inches, centimeters)     na 3. LOCATION: Where is the rash located?     Right leg - outside of chin and spreading 4. COLOR: What color is the rash? (Note: It is difficult to assess rash color in people with darker-colored skin. When this situation occurs, simply ask the caller to describe what they see.)     red 5. ONSET: When did the rash begin?     Month and worsening 6. FEVER: Do you have a fever? If Yes, ask: What is your temperature, how was it measured, and when did it start?     no 7. ITCHING: Does the rash itch? If Yes, ask: How bad is the itch? (Scale 1-10; or mild, moderate, severe)     moderate 8. CAUSE: What do you think is causing the rash?     unknown 9. MEDICINE FACTORS: Have you started any new medicines within the last 2 weeks?  (e.g., antibiotics)      no 10. OTHER SYMPTOMS: Do you have any other symptoms? (e.g., dizziness, headache, sore throat, joint pain)       Rash felt warm 11. PREGNANCY: Is there any chance you are pregnant? When was your last menstrual period?       na   Anti-itch lotions & creates  Protocols used: Rash or Redness - [facility]

## 2023-11-03 NOTE — Telephone Encounter (Signed)
 Appt scheduled

## 2023-11-03 NOTE — Progress Notes (Signed)
 Subjective:    Patient ID: Virginia Parrish, female    DOB: 1945/03/25, 78 y.o.   MRN: [id number]  HPI   Kash Mothershead is a 78 year old female with rash and htn.   She has a rash that began approximately a month ago, initially thought to be dry skin. The rash is itchy, dry, and flaky, located on her right calf, and has expanded slightly over time. She has tried hydrocortisone, Cerave, Sarna, and clobetasol with temporary relief but no resolution. The rash is not painful but does itch.   Pt when used steroid only use for 2 days whether it was hydrocortisone or clobetasole.  She has a history of hypertension and has been on aldactazide for over 20 years. She mentions no issues with the medication but questions if it contributes to her dry skin. Her last kidney function test was over a year ago, with a GFR of 73.  Some elevated sugar in past.  No wheezing, history of eczema, or sensitive skin.      Review of Systems  Constitutional:  Negative for chills and fever.  Respiratory:  Negative for cough, choking and wheezing.   Cardiovascular:  Negative for chest pain and palpitations.  Gastrointestinal:  Negative for abdominal pain, constipation and rectal pain.  Musculoskeletal:  Negative for back pain, myalgias and neck stiffness.  Skin:  Positive for rash.       See hpi.  Neurological:  Negative for dizziness, tremors, seizures and weakness.  Hematological:  Negative for adenopathy.  Psychiatric/Behavioral:  Negative for behavioral problems, hallucinations, sleep disturbance and suicidal ideas.     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     Social History    Socioeconomic History   Marital status: Married    Spouse name: Not on file   Number of children: Not on file   Years of education: Not on file   Highest education level: Professional school degree (e.g., MD, DDS, DVM, JD)  Occupational History   Occupation: Retired  Tobacco Use   Smoking status: Former    Current packs/day: 0.00    Types: Cigarettes    Quit date: 12/15/1971    Years since quitting: 51.9   Smokeless tobacco: Never   Tobacco comments:    only smoked in high school and college  Vaping Use   Vaping status: Never Used  Substance and Sexual Activity   Alcohol use: Yes    Comment: wine with dinner   Drug use: No   Sexual activity: Yes    Partners: Male  Other Topics Concern   Not on file  Social History Narrative   Not on file   Social Drivers of Health   Financial Resource Strain: Low Risk  (11/03/2023)   Overall Financial Resource Strain (CARDIA)    Difficulty of Paying Living Expenses: Not hard at all  Food Insecurity: No Food Insecurity (11/03/2023)   Hunger Vital Sign    Worried About Running Out of Food in the Last Year: Never true    Ran Out of Food in the Last Year: Never true  Transportation Needs: No Transportation Needs (11/03/2023)  PRAPARE - Administrator, Civil Service (Medical): No    Lack of Transportation (Non-Medical): No  Physical Activity: Sufficiently Active (11/03/2023)   Exercise Vital Sign    Days of Exercise per Week: 6 days    Minutes of Exercise per Session: 30 min  Stress: Stress Concern Present (11/03/2023)   [employer] of Occupational Health - Occupational Stress Questionnaire    Feeling of Stress: To some extent  Social Connections: Socially Integrated (11/03/2023)   Social Connection and Isolation Panel    Frequency of Communication with Friends and Family: Twice a week    Frequency of Social Gatherings with Friends and Family: Once a week    Attends Religious Services: More than 4 times per year     Active Member of [employer] or Organizations: Yes    Attends Banker Meetings: More than 4 times per year    Marital Status: Married  Catering manager Violence: Not At Risk (04/23/2023)   Humiliation, Afraid, Rape, and Kick questionnaire    Fear of Current or Ex-Partner: No    Emotionally Abused: No    Physically Abused: No    Sexually Abused: No    Past Surgical History:  Procedure Laterality Date   ABDOMINAL HYSTERECTOMY  2007   Complete due to cervical cancer   CHOLECYSTECTOMY  2003   COLONOSCOPY     around 2011 in [place]  cologuard around 2016   EYE SURGERY Bilateral 05/12/2018   cataract sx   HERNIA REPAIR  2014-2015   abdominal hernia with mesh   KNEE SURGERY     bilateral knee replacement   WISDOM TOOTH EXTRACTION      Family History  Problem Relation Age of Onset   Arthritis Mother    Hearing loss Mother    Hyperlipidemia Mother    Hypertension Mother    Kidney disease Mother    Arthritis Father    Heart disease Father    Drug abuse Maternal Grandmother    Arthritis Maternal Grandfather    Drug abuse Paternal Grandfather    Esophageal cancer Neg Hx    Colon cancer Neg Hx     No Known Allergies  Current Outpatient Medications on File Prior to Visit  Medication Sig Dispense Refill   allopurinol  (ZYLOPRIM ) 300 MG tablet TAKE 1 TABLET BY MOUTH DAILY 90 tablet 0   CALCIUM PO Take 1 tablet by mouth daily.     clotrimazole -betamethasone  (LOTRISONE ) cream Apply 1 application. topically as needed. 45 g 0   indomethacin  (INDOCIN ) 50 MG capsule Take 1 capsule (50 mg total) by mouth 3 (three) times daily with meals. As needed. 90 capsule 1   levothyroxine  (SYNTHROID ) 75 MCG tablet TAKE 1 TABLET BY MOUTH DAILY 90 tablet 3   pantoprazole  (PROTONIX ) 40 MG tablet TAKE 1 TABLET BY MOUTH DAILY 90 tablet 3   spironolactone -hydrochlorothiazide (ALDACTAZIDE) 25-25 MG tablet TAKE 1 TABLET BY MOUTH DAILY 90 tablet 0   No current facility-administered medications on  file prior to visit.    BP 137/80   Pulse 69   Temp 97.8 F (36.6 C) (Oral)   Resp 16   Ht 5' 4 (1.626 m)   Wt 228 lb 3.2 oz (103.5 kg)   SpO2 97%   BMI 39.17 kg/m           Objective:   Physical Exam  General Mental Status- Alert. General Appearance- Not in acute distress.     Chest and Lung Exam Auscultation: Breath Sounds:-CTA  Cardiovascular  Auscultation:Rythm- RRR Murmurs & Other Heart Sounds:Auscultation of the heart reveals- No Murmurs.    SKIN(rt lateral calf): Epidermis dry and flaky, appears some dry skin  scratched off. Splotchy pink area approximately 8 cm x 2.5 cm. Not indurated, not fluctuant, not inflamed, not warm and not tender. Dry skin prominent.  Neurologic Cranial Nerve exam:- CN III-XII intact(No nystagmus), symmetric smile. Strength:- 5/5 equal and symmetric strength both upper and lower extremities.       Assessment & Plan:   Patient Instructions  Localized dermatitis of right calf Suspected allergic dermatitis with dry, flaky, itchy skin. Previous treatments provided temporary relief. - Prescribed triamcinolone  cream twice daily. - Advised moisturizing once daily with Palmer's lotion containing vitamin E. - Instructed to monitor for changes such as redness, warmth, tenderness, or induration and report if these occur. - Scheduled follow-up in 7-10 days to reassess. - Consider referral to dermatologist if no improvement or worsening.  Essential hypertension Long-standing hypertension managed with aldactazide. Current regimen includes two diuretics, uncommon in contemporary treatment. Monitoring kidney function and blood pressure is necessary. - Check blood pressure during the visit.  Elevated sugar - Order metabolic panel and A1c to assess kidney function and blood sugar levels. - Consider bp medication adjustment based on lab results and blood pressure readings.  Follow up 7 -10 days or as needed   Valentine Barney, PA-C

## 2023-11-04 LAB — COMPREHENSIVE METABOLIC PANEL WITH GFR
ALT: 12 U/L (ref 0–35)
AST: 16 U/L (ref 0–37)
Albumin: 4.5 g/dL (ref 3.5–5.2)
Alkaline Phosphatase: 71 U/L (ref 39–117)
BUN: 19 mg/dL (ref 6–23)
CO2: 31 meq/L (ref 19–32)
Calcium: 10.1 mg/dL (ref 8.4–10.5)
Chloride: 101 meq/L (ref 96–112)
Creatinine, Ser: 0.93 mg/dL (ref 0.40–1.20)
GFR: 59.82 mL/min — ABNORMAL LOW (ref >60.00)
Glucose, Bld: 108 mg/dL — ABNORMAL HIGH (ref 70–99)
Potassium: 4.6 meq/L (ref 3.5–5.1)
Sodium: 141 meq/L (ref 135–145)
Total Bilirubin: 0.4 mg/dL (ref 0.2–1.2)
Total Protein: 6.6 g/dL (ref 6.0–8.3)

## 2023-11-04 LAB — HEMOGLOBIN A1C: Hgb A1c MFr Bld: 6 % (ref 4.6–6.5)

## 2023-11-05 ENCOUNTER — Ambulatory Visit: Payer: Self-pay | Admitting: Medical

## 2023-11-10 DIAGNOSIS — H26491 Other secondary cataract, right eye: Secondary | ICD-10-CM | POA: Diagnosis not present

## 2023-11-12 ENCOUNTER — Ambulatory Visit: Admitting: Medical

## 2023-11-13 ENCOUNTER — Encounter: Payer: Self-pay | Admitting: Family Medicine

## 2023-11-13 ENCOUNTER — Ambulatory Visit: Admitting: Family Medicine

## 2023-11-13 VITALS — BP 114/80 | HR 62 | Temp 97.9°F | Resp 18 | Ht 64.0 in | Wt 229.0 lb

## 2023-11-13 DIAGNOSIS — L249 Irritant contact dermatitis, unspecified cause: Secondary | ICD-10-CM

## 2023-11-13 MED ORDER — DOXYCYCLINE HYCLATE 100 MG PO TABS: 100.0000 mg | ORAL_TABLET | Freq: Two times a day (BID) | ORAL | 0 refills | Status: DC

## 2023-11-13 MED ORDER — PREDNISONE 10 MG PO TABS: ORAL_TABLET | ORAL | 0 refills | Status: DC

## 2023-11-13 NOTE — Progress Notes (Signed)
 Subjective:    Patient ID: Virginia Parrish, female    DOB: 09-14-1945, 78 y.o.   MRN: [id number]  Chief Complaint  Patient presents with   Dermatitis    Right calf, pt was given cream by Saguier and advised vitamin E oil.     HPI Patient is in today for rash R calf.  Discussed the use of AI scribe software for clinical note transcription with the patient, who gave verbal consent to proceed.  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    Past Surgical History:  Procedure Laterality Date   ABDOMINAL HYSTERECTOMY  2007   Complete due to cervical cancer   CHOLECYSTECTOMY  2003   COLONOSCOPY     around 2011 in [place]  cologuard around 2016   EYE SURGERY Bilateral 05/12/2018   cataract sx   HERNIA REPAIR  2014-2015   abdominal hernia with mesh   KNEE SURGERY     bilateral knee replacement   WISDOM TOOTH EXTRACTION      Family History  Problem Relation Age of Onset   Arthritis Mother    Hearing loss Mother    Hyperlipidemia Mother    Hypertension Mother    Kidney disease Mother    Arthritis Father    Heart disease Father    Drug abuse Maternal Grandmother    Arthritis Maternal Grandfather    Drug abuse Paternal Grandfather    Esophageal cancer Neg Hx    Colon cancer Neg Hx     Social History   Socioeconomic History   Marital status: Married    Spouse name: Not on file   Number of children: Not on file   Years of education: Not on file   Highest education level: Professional school degree (e.g., MD, DDS, DVM, JD)  Occupational History   Occupation: Retired  Tobacco Use   Smoking status: Former    Current packs/day: 0.00    Types:  Cigarettes    Quit date: 12/15/1971    Years since quitting: 52.1   Smokeless tobacco: Never   Tobacco comments:    only smoked in high school and college  Vaping Use   Vaping status: Never Used  Substance and Sexual Activity   Alcohol use: Yes    Comment: wine with dinner   Drug use: No   Sexual activity: Yes    Partners: Male  Other Topics Concern   Not on file  Social History Narrative   Not on file   Social Drivers of Health   Financial Resource Strain: Low Risk  (11/26/2023)   Overall Financial Resource Strain (CARDIA)    Difficulty of Paying Living Expenses: Not hard at all  Food Insecurity: No Food Insecurity (11/26/2023)   Hunger Vital Sign    Worried About Running Out of Food in the Last Year: Never true    Ran Out of Food in the Last Year: Never true  Transportation Needs: No Transportation Needs (11/26/2023)   PRAPARE - Administrator, Civil Service (Medical): No    Lack of Transportation (Non-Medical): No  Physical Activity: Sufficiently Active (  11/26/2023)   Exercise Vital Sign    Days of Exercise per Week: 5 days    Minutes of Exercise per Session: 30 min  Stress: No Stress Concern Present (11/26/2023)   [employer] of Occupational Health - Occupational Stress Questionnaire    Feeling of Stress: Not at all  Recent Concern: Stress - Stress Concern Present (11/03/2023)   [employer] of Occupational Health - Occupational Stress Questionnaire    Feeling of Stress: To some extent  Social Connections: Socially Integrated (11/26/2023)   Social Connection and Isolation Panel    Frequency of Communication with Friends and Family: Twice a week    Frequency of Social Gatherings with Friends and Family: Once a week    Attends Religious Services: More than 4 times per year    Active Member of [employer] or Organizations: Yes    Attends Engineer, Structural: More than 4 times per year    Marital Status: Married  Catering Manager Violence: Not  At Risk (04/23/2023)   Humiliation, Afraid, Rape, and Kick questionnaire    Fear of Current or Ex-Partner: No    Emotionally Abused: No    Physically Abused: No    Sexually Abused: No    Outpatient Medications Prior to Visit  Medication Sig Dispense Refill   CALCIUM PO Take 1 tablet by mouth daily.     clotrimazole -betamethasone  (LOTRISONE ) cream Apply 1 application. topically as needed. 45 g 0   indomethacin  (INDOCIN ) 50 MG capsule Take 1 capsule (50 mg total) by mouth 3 (three) times daily with meals. As needed. 90 capsule 1   levothyroxine  (SYNTHROID ) 75 MCG tablet TAKE 1 TABLET BY MOUTH DAILY 90 tablet 3   pantoprazole  (PROTONIX ) 40 MG tablet TAKE 1 TABLET BY MOUTH DAILY 90 tablet 3   allopurinol  (ZYLOPRIM ) 300 MG tablet TAKE 1 TABLET BY MOUTH DAILY 90 tablet 0   spironolactone -hydrochlorothiazide (ALDACTAZIDE) 25-25 MG tablet TAKE 1 TABLET BY MOUTH DAILY 90 tablet 0   triamcinolone  cream (KENALOG ) 0.1 % Apply 1 Application topically 2 (two) times daily. 30 g 0   No facility-administered medications prior to visit.    No Known Allergies  Review of Systems  Constitutional:  Negative for chills, fever and malaise/fatigue.  HENT:  Negative for congestion and hearing loss.   Eyes:  Negative for blurred vision and discharge.  Respiratory:  Negative for cough, sputum production and shortness of breath.   Cardiovascular:  Negative for chest pain, palpitations and leg swelling.  Gastrointestinal:  Negative for abdominal pain, blood in stool, constipation, diarrhea, heartburn, nausea and vomiting.  Genitourinary:  Negative for dysuria, frequency, hematuria and urgency.  Musculoskeletal:  Negative for back pain, falls and myalgias.  Skin:  Positive for rash.  Neurological:  Negative for dizziness, sensory change, loss of consciousness, weakness and headaches.  Endo/Heme/Allergies:  Negative for environmental allergies. Does not bruise/bleed easily.  Psychiatric/Behavioral:  Negative for  depression and suicidal ideas. The patient is not nervous/anxious and does not have insomnia.        Objective:    Physical Exam Vitals and nursing note reviewed.  Constitutional:      General: She is not in acute distress.    Appearance: Normal appearance. She is well-developed.  HENT:     Head: Normocephalic and atraumatic.     Right Ear: Tympanic membrane, ear canal and external ear normal. There is no impacted cerumen.     Left Ear: Tympanic membrane, ear canal and external ear normal. There is no impacted  cerumen.     Nose: Nose normal.     Mouth/Throat:     Mouth: Mucous membranes are moist.     Pharynx: Oropharynx is clear. No oropharyngeal exudate or posterior oropharyngeal erythema.  Eyes:     General: No scleral icterus.       Right eye: No discharge.        Left eye: No discharge.     Conjunctiva/sclera: Conjunctivae normal.     Pupils: Pupils are equal, round, and reactive to light.  Neck:     Thyroid : No thyromegaly or thyroid  tenderness.     Vascular: No JVD.  Cardiovascular:     Rate and Rhythm: Normal rate and regular rhythm.     Heart sounds: Normal heart sounds. No murmur heard. Pulmonary:     Effort: Pulmonary effort is normal. No respiratory distress.     Breath sounds: Normal breath sounds.  Abdominal:     General: Bowel sounds are normal. There is no distension.     Palpations: Abdomen is soft. There is no mass.     Tenderness: There is no abdominal tenderness. There is no guarding or rebound.  Genitourinary:    Vagina: Normal.  Musculoskeletal:        General: Normal range of motion.     Cervical back: Normal range of motion and neck supple.     Right lower leg: No edema.     Left lower leg: No edema.  Lymphadenopathy:     Cervical: No cervical adenopathy.  Skin:    General: Skin is warm and dry.     Findings: Ecchymosis, erythema and rash present.      Neurological:     Mental Status: She is alert and oriented to person, place, and time.      Cranial Nerves: No cranial nerve deficit.     Deep Tendon Reflexes: Reflexes are normal and symmetric.  Psychiatric:        Mood and Affect: Mood normal.        Behavior: Behavior normal.        Thought Content: Thought content normal.        Judgment: Judgment normal.     BP 114/80 (BP Location: Right Arm, Patient Position: Sitting, Cuff Size: Large)   Pulse 62   Temp 97.9 F (36.6 C) (Oral)   Resp 18   Ht 5' 4 (1.626 m)   Wt 229 lb (103.9 kg)   SpO2 98%   BMI 39.31 kg/m  Wt Readings from Last 3 Encounters:  11/27/23 228 lb 6.4 oz (103.6 kg)  11/13/23 229 lb (103.9 kg)  11/03/23 228 lb 3.2 oz (103.5 kg)    Diabetic Foot Exam - Simple   No data filed    Lab Results  Component Value Date   WBC 6.7 11/27/2023   HGB 14.2 11/27/2023   HCT 44.0 11/27/2023   PLT 214.0 11/27/2023   GLUCOSE 115 (H) 11/27/2023   CHOL 197 07/15/2022   TRIG 93.0 07/15/2022   HDL 72.30 07/15/2022   LDLCALC 106 (H) 07/15/2022   ALT 11 11/27/2023   AST 15 11/27/2023   NA 144 11/27/2023   K 3.7 11/27/2023   CL 101 11/27/2023   CREATININE 0.90 11/27/2023   BUN 16 11/27/2023   CO2 32 11/27/2023   TSH 0.82 07/15/2022   HGBA1C 6.0 11/03/2023    Lab Results  Component Value Date   TSH 0.82 07/15/2022   Lab Results  Component Value Date   WBC  6.7 11/27/2023   HGB 14.2 11/27/2023   HCT 44.0 11/27/2023   MCV 92.6 11/27/2023   PLT 214.0 11/27/2023   Lab Results  Component Value Date   NA 144 11/27/2023   K 3.7 11/27/2023   CO2 32 11/27/2023   GLUCOSE 115 (H) 11/27/2023   BUN 16 11/27/2023   CREATININE 0.90 11/27/2023   BILITOT 0.7 11/27/2023   ALKPHOS 61 11/27/2023   AST 15 11/27/2023   ALT 11 11/27/2023   PROT 6.3 11/27/2023   ALBUMIN 4.3 11/27/2023   CALCIUM 9.5 11/27/2023   GFR 62.19 11/27/2023   Lab Results  Component Value Date   CHOL 197 07/15/2022   Lab Results  Component Value Date   HDL 72.30 07/15/2022   Lab Results  Component Value Date   LDLCALC 106  (H) 07/15/2022   Lab Results  Component Value Date   TRIG 93.0 07/15/2022   Lab Results  Component Value Date   CHOLHDL 3 07/15/2022   Lab Results  Component Value Date   HGBA1C 6.0 11/03/2023       Assessment & Plan:  Irritant contact dermatitis of lower leg Assessment & Plan: Doxycycline  and pred Return to office if rash persists or worsens    Obesity, morbid (HCC)  Assessment and Plan Assessment & Plan  Assessment and Plan Assessment & Plan      Jamee JONELLE Antonio Cyndee, DO

## 2023-11-16 ENCOUNTER — Encounter: Payer: Self-pay | Admitting: Family Medicine

## 2023-11-24 ENCOUNTER — Encounter: Payer: Self-pay | Admitting: Family Medicine

## 2023-11-25 ENCOUNTER — Other Ambulatory Visit: Payer: Self-pay | Admitting: Family Medicine

## 2023-11-25 DIAGNOSIS — L249 Irritant contact dermatitis, unspecified cause: Secondary | ICD-10-CM

## 2023-11-27 ENCOUNTER — Ambulatory Visit: Admitting: Family Medicine

## 2023-11-27 ENCOUNTER — Encounter: Payer: Self-pay | Admitting: Family Medicine

## 2023-11-27 VITALS — BP 130/82 | HR 61 | Temp 98.2°F | Resp 18 | Ht 64.0 in | Wt 228.4 lb

## 2023-11-27 DIAGNOSIS — L249 Irritant contact dermatitis, unspecified cause: Secondary | ICD-10-CM

## 2023-11-27 LAB — CBC WITH DIFFERENTIAL/PLATELET
Basophils Absolute: 0 K/uL (ref 0.0–0.1)
Basophils Relative: 0.7 % (ref 0.0–3.0)
Eosinophils Absolute: 0.2 K/uL (ref 0.0–0.7)
Eosinophils Relative: 3.1 % (ref 0.0–5.0)
HCT: 44 % (ref 36.0–46.0)
Hemoglobin: 14.2 g/dL (ref 12.0–15.0)
Lymphocytes Relative: 25.9 % (ref 12.0–46.0)
Lymphs Abs: 1.7 K/uL (ref 0.7–4.0)
MCHC: 32.4 g/dL (ref 30.0–36.0)
MCV: 92.6 fl (ref 78.0–100.0)
Monocytes Absolute: 0.5 K/uL (ref 0.1–1.0)
Monocytes Relative: 6.8 % (ref 3.0–12.0)
Neutro Abs: 4.3 K/uL (ref 1.4–7.7)
Neutrophils Relative %: 63.5 % (ref 43.0–77.0)
Platelets: 214 K/uL (ref 150.0–400.0)
RBC: 4.75 Mil/uL (ref 3.87–5.11)
RDW: 15.3 % (ref 11.5–15.5)
WBC: 6.7 K/uL (ref 4.0–10.5)

## 2023-11-27 LAB — COMPREHENSIVE METABOLIC PANEL WITH GFR
ALT: 11 U/L (ref 0–35)
AST: 15 U/L (ref 0–37)
Albumin: 4.3 g/dL (ref 3.5–5.2)
Alkaline Phosphatase: 61 U/L (ref 39–117)
BUN: 16 mg/dL (ref 6–23)
CO2: 32 meq/L (ref 19–32)
Calcium: 9.5 mg/dL (ref 8.4–10.5)
Chloride: 101 meq/L (ref 96–112)
Creatinine, Ser: 0.9 mg/dL (ref 0.40–1.20)
GFR: 62.19 mL/min (ref >60.00)
Glucose, Bld: 115 mg/dL — ABNORMAL HIGH (ref 70–99)
Potassium: 3.7 meq/L (ref 3.5–5.1)
Sodium: 144 meq/L (ref 135–145)
Total Bilirubin: 0.7 mg/dL (ref 0.2–1.2)
Total Protein: 6.3 g/dL (ref 6.0–8.3)

## 2023-11-27 MED ORDER — TRIAMCINOLONE ACETONIDE 0.1 % EX CREA: 1.0000 | TOPICAL_CREAM | Freq: Two times a day (BID) | CUTANEOUS | 1 refills | Status: AC

## 2023-11-27 MED ORDER — AMOXICILLIN-POT CLAVULANATE 875-125 MG PO TABS: 1.0000 | ORAL_TABLET | Freq: Two times a day (BID) | ORAL | 0 refills | Status: AC

## 2023-11-27 NOTE — Progress Notes (Signed)
 Subjective:    Patient ID: Virginia Parrish, female    DOB: 23-Apr-1945, 78 y.o.   MRN: [id number]  Chief Complaint  Patient presents with   Rash    Right calf, x6 weeks, got better but not gone    Follow-up    HPI Patient is in today for f/u rash on R low leg.  Discussed the use of AI scribe software for clinical note transcription with the patient, who gave verbal consent to proceed.  History of Present Illness Virginia Parrish is a 78 year old female who presents with a persistent itchy and red rash on her leg.  She has been experiencing a persistent rash on her leg that is itchy and red. She has been using triamcinolone  and Palmer's lotion, which have provided some relief. She completed a course of prednisone and doxycycline, which improved the condition, but the rash remains.  The rash is sensitive to hot water, causing discomfort when showering. She describes the sensation as sometimes feeling like there is a fever in the area, although it is not consistently warm to the touch. The rash is not sore, and there are no known insect or tick bites.  She lives with her husband, who has not shown any symptoms, and she has not observed the rash spreading. She is planning to visit her brother in [place] , who has recently undergone a liver transplant. She is concerned about whether the rash could be contagious.    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    Past Surgical History:  Procedure Laterality Date   ABDOMINAL HYSTERECTOMY  2007   Complete due to cervical cancer   CHOLECYSTECTOMY  2003   COLONOSCOPY     around 2011 in [place]  cologuard around 2016   EYE SURGERY  Bilateral 05/12/2018   cataract sx   HERNIA REPAIR  2014-2015   abdominal hernia with mesh   KNEE SURGERY     bilateral knee replacement   WISDOM TOOTH EXTRACTION      Family History  Problem Relation Age of Onset   Arthritis Mother    Hearing loss Mother    Hyperlipidemia Mother    Hypertension Mother    Kidney disease Mother    Arthritis Father    Heart disease Father    Drug abuse Maternal Grandmother    Arthritis Maternal Grandfather    Drug abuse Paternal Grandfather    Esophageal cancer Neg Hx    Colon cancer Neg Hx     Social History   Socioeconomic History   Marital status: Married    Spouse name: Not on file   Number of children: Not on file   Years of education: Not on file   Highest education level: Professional school degree (e.g., MD, DDS, DVM, JD)  Occupational History   Occupation: Retired  Tobacco Use   Smoking status: Former    Current packs/day: 0.00    Types: Cigarettes    Quit date: 12/15/1971    Years since quitting: 51.9   Smokeless tobacco: Never   Tobacco comments:    only smoked in high school and college  Vaping  Use   Vaping status: Never Used  Substance and Sexual Activity   Alcohol use: Yes    Comment: wine with dinner   Drug use: No   Sexual activity: Yes    Partners: Male  Other Topics Concern   Not on file  Social History Narrative   Not on file   Social Drivers of Health   Financial Resource Strain: Low Risk  (11/26/2023)   Overall Financial Resource Strain (CARDIA)    Difficulty of Paying Living Expenses: Not hard at all  Food Insecurity: No Food Insecurity (11/26/2023)   Hunger Vital Sign    Worried About Running Out of Food in the Last Year: Never true    Ran Out of Food in the Last Year: Never true  Transportation Needs: No Transportation Needs (11/26/2023)   PRAPARE - Administrator, Civil Service (Medical): No    Lack of Transportation (Non-Medical): No  Physical Activity: Sufficiently Active  (11/26/2023)   Exercise Vital Sign    Days of Exercise per Week: 5 days    Minutes of Exercise per Session: 30 min  Stress: No Stress Concern Present (11/26/2023)   [employer] of Occupational Health - Occupational Stress Questionnaire    Feeling of Stress: Not at all  Recent Concern: Stress - Stress Concern Present (11/03/2023)   [employer] of Occupational Health - Occupational Stress Questionnaire    Feeling of Stress: To some extent  Social Connections: Socially Integrated (11/26/2023)   Social Connection and Isolation Panel    Frequency of Communication with Friends and Family: Twice a week    Frequency of Social Gatherings with Friends and Family: Once a week    Attends Religious Services: More than 4 times per year    Active Member of [employer] or Organizations: Yes    Attends Engineer, structural: More than 4 times per year    Marital Status: Married  Catering manager Violence: Not At Risk (04/23/2023)   Humiliation, Afraid, Rape, and Kick questionnaire    Fear of Current or Ex-Partner: No    Emotionally Abused: No    Physically Abused: No    Sexually Abused: No    Outpatient Medications Prior to Visit  Medication Sig Dispense Refill   allopurinol  (ZYLOPRIM ) 300 MG tablet TAKE 1 TABLET BY MOUTH DAILY 90 tablet 0   CALCIUM PO Take 1 tablet by mouth daily.     clotrimazole -betamethasone  (LOTRISONE ) cream Apply 1 application. topically as needed. 45 g 0   indomethacin  (INDOCIN ) 50 MG capsule Take 1 capsule (50 mg total) by mouth 3 (three) times daily with meals. As needed. 90 capsule 1   levothyroxine  (SYNTHROID ) 75 MCG tablet TAKE 1 TABLET BY MOUTH DAILY 90 tablet 3   pantoprazole  (PROTONIX ) 40 MG tablet TAKE 1 TABLET BY MOUTH DAILY 90 tablet 3   spironolactone -hydrochlorothiazide (ALDACTAZIDE) 25-25 MG tablet TAKE 1 TABLET BY MOUTH DAILY 90 tablet 0   triamcinolone  cream (KENALOG ) 0.1 % Apply 1 Application topically 2 (two) times daily. 30 g 0   doxycycline  (VIBRA-TABS) 100 MG tablet Take 1 tablet (100 mg total) by mouth 2 (two) times daily. 20 tablet 0   predniSONE (DELTASONE) 10 MG tablet TAKE 3 TABLETS PO QD FOR 3 DAYS THEN TAKE 2 TABLETS PO QD FOR 3 DAYS THEN TAKE 1 TABLET PO QD FOR 3 DAYS THEN TAKE 1/2 TAB PO QD FOR 3 DAYS 20 tablet 0   No facility-administered medications prior to visit.    No Known  Allergies  Review of Systems  Constitutional:  Negative for chills, fever and malaise/fatigue.  HENT:  Negative for congestion and hearing loss.   Eyes:  Negative for blurred vision and discharge.  Respiratory:  Negative for cough, sputum production and shortness of breath.   Cardiovascular:  Negative for chest pain, palpitations and leg swelling.  Gastrointestinal:  Negative for abdominal pain, blood in stool, constipation, diarrhea, heartburn, nausea and vomiting.  Genitourinary:  Negative for dysuria, frequency, hematuria and urgency.  Musculoskeletal:  Negative for back pain, falls and myalgias.  Skin:  Positive for itching and rash.  Neurological:  Negative for dizziness, sensory change, loss of consciousness, weakness and headaches.  Endo/Heme/Allergies:  Negative for environmental allergies. Does not bruise/bleed easily.  Psychiatric/Behavioral:  Negative for depression and suicidal ideas. The patient is not nervous/anxious and does not have insomnia.        Objective:    Physical Exam  BP 130/82 (BP Location: Left Arm, Patient Position: Sitting, Cuff Size: Large)   Pulse 61   Temp 98.2 F (36.8 C) (Oral)   Resp 18   Ht 5' 4 (1.626 m)   Wt 228 lb 6.4 oz (103.6 kg)   SpO2 98%   BMI 39.20 kg/m  Wt Readings from Last 3 Encounters:  11/27/23 228 lb 6.4 oz (103.6 kg)  11/13/23 229 lb (103.9 kg)  11/03/23 228 lb 3.2 oz (103.5 kg)    Diabetic Foot Exam - Simple   No data filed    Lab Results  Component Value Date   WBC 6.6 07/15/2022   HGB 14.3 07/15/2022   HCT 43.6 07/15/2022   PLT 262.0 07/15/2022   GLUCOSE 108  (H) 11/03/2023   CHOL 197 07/15/2022   TRIG 93.0 07/15/2022   HDL 72.30 07/15/2022   LDLCALC 106 (H) 07/15/2022   ALT 12 11/03/2023   AST 16 11/03/2023   NA 141 11/03/2023   K 4.6 11/03/2023   CL 101 11/03/2023   CREATININE 0.93 11/03/2023   BUN 19 11/03/2023   CO2 31 11/03/2023   TSH 0.82 07/15/2022   HGBA1C 6.0 11/03/2023    Lab Results  Component Value Date   TSH 0.82 07/15/2022   Lab Results  Component Value Date   WBC 6.6 07/15/2022   HGB 14.3 07/15/2022   HCT 43.6 07/15/2022   MCV 92.1 07/15/2022   PLT 262.0 07/15/2022   Lab Results  Component Value Date   NA 141 11/03/2023   K 4.6 11/03/2023   CO2 31 11/03/2023   GLUCOSE 108 (H) 11/03/2023   BUN 19 11/03/2023   CREATININE 0.93 11/03/2023   BILITOT 0.4 11/03/2023   ALKPHOS 71 11/03/2023   AST 16 11/03/2023   ALT 12 11/03/2023   PROT 6.6 11/03/2023   ALBUMIN 4.5 11/03/2023   CALCIUM 10.1 11/03/2023   GFR 59.82 (L) 11/03/2023   Lab Results  Component Value Date   CHOL 197 07/15/2022   Lab Results  Component Value Date   HDL 72.30 07/15/2022   Lab Results  Component Value Date   LDLCALC 106 (H) 07/15/2022   Lab Results  Component Value Date   TRIG 93.0 07/15/2022   Lab Results  Component Value Date   CHOLHDL 3 07/15/2022   Lab Results  Component Value Date   HGBA1C 6.0 11/03/2023       Assessment & Plan:  Irritant contact dermatitis of lower leg -     CBC with Differential/Platelet -     Comprehensive metabolic panel with  GFR -     Rocky mtn spotted fvr abs pnl(IgG+IgM) -     Lyme Disease Serology w/Reflex -     Amoxicillin -Pot Clavulanate; Take 1 tablet by mouth 2 (two) times daily.  Dispense: 20 tablet; Refill: 0 -     Triamcinolone  Acetonide; Apply 1 Application topically 2 (two) times daily.  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 A possible tick bite is considered in the differential diagnosis. The rash has not spread and is not believed to be contagious, alleviating her concern about visiting her brother post-liver transplant. Continue triamcinolone  cream as needed for pruritus. Prescribe Augmentin 875 mg twice daily for ten days to address potential tick bite. Order blood work to rule out tick-borne illnesses. Follow up with dermatologist Dr. Niels Bless in two weeks. Refill triamcinolone  prescription.    Sheniya Garciaperez R Lowne Chase, DO

## 2023-11-30 ENCOUNTER — Encounter: Payer: Self-pay | Admitting: *Deleted

## 2023-11-30 ENCOUNTER — Other Ambulatory Visit: Payer: Self-pay | Admitting: *Deleted

## 2023-11-30 LAB — LYME DISEASE SEROLOGY W/REFLEX: Lyme Total Antibody EIA: NEGATIVE

## 2023-11-30 LAB — ROCKY MTN SPOTTED FVR ABS PNL(IGG+IGM)
RMSF IgG: NOT DETECTED
RMSF IgM: NOT DETECTED

## 2023-11-30 MED ORDER — SPIRONOLACTONE-HCTZ 25-25 MG PO TABS: 1.0000 | ORAL_TABLET | Freq: Every day | ORAL | 0 refills | Status: DC

## 2023-12-01 ENCOUNTER — Ambulatory Visit: Payer: Self-pay | Admitting: Family Medicine

## 2023-12-01 NOTE — Telephone Encounter (Signed)
 Left a voicemail to get her scheduled

## 2023-12-14 ENCOUNTER — Telehealth: Payer: Self-pay

## 2023-12-14 NOTE — Telephone Encounter (Signed)
 LVM to call office.

## 2023-12-22 ENCOUNTER — Other Ambulatory Visit: Payer: Self-pay | Admitting: Family Medicine

## 2023-12-22 DIAGNOSIS — M109 Gout, unspecified: Secondary | ICD-10-CM

## 2023-12-22 MED ORDER — ALLOPURINOL 300 MG PO TABS: 300.0000 mg | ORAL_TABLET | Freq: Every day | ORAL | 0 refills | Status: DC

## 2023-12-22 NOTE — Telephone Encounter (Signed)
 Copied from CRM #[id number]. Topic: Clinical - Medication Refill >> Dec 22, 2023 11:14 AM [name] wrote: Medication: allopurinol  (ZYLOPRIM ) 300 MG tablet  Has the patient contacted their pharmacy? Yes, medications were previously filled under [name], FNP. Patient is scheduled for TOC with Dr. Antonio Meth.   This is the patient's preferred pharmacy:  [facility] [id number] [place], [place] - [address] [place] [address] Phone: [phone] Fax: [phone]  Is this the correct pharmacy for this prescription? Yes If no, delete pharmacy and type the correct one.   Has the prescription been filled recently? No  Is the patient out of the medication? Yes  Has the patient been seen for an appointment in the last year OR does the patient have an upcoming appointment? Patient recently saw Dr. Antonio Meth on 10/03 and 10/17. Patient also has TOC scheduled on 07/21/24 with Dr. Antonio Meth.   Can we respond through MyChart? Patient would appreciate a notification on Mychart or a phone call/voicemail to know if her medications can be filled or not. Patient said she sent in a request two weeks ago and did not hear anything from the pharmacy or clinic. Patient would appreciate a follow up call or message.   Agent: Please be advised that Rx refills may take up to 3 business days. We ask that you follow-up with your pharmacy.

## 2024-01-18 DIAGNOSIS — L249 Irritant contact dermatitis, unspecified cause: Secondary | ICD-10-CM | POA: Insufficient documentation

## 2024-01-18 NOTE — Assessment & Plan Note (Signed)
 Doxycycline  and pred Return to office if rash persists or worsens

## 2024-01-25 ENCOUNTER — Other Ambulatory Visit: Payer: Self-pay | Admitting: Family Medicine

## 2024-01-25 DIAGNOSIS — N6320 Unspecified lump in the left breast, unspecified quadrant: Secondary | ICD-10-CM

## 2024-01-26 DIAGNOSIS — K080 Exfoliation of teeth due to systemic causes: Secondary | ICD-10-CM | POA: Diagnosis not present

## 2024-03-03 ENCOUNTER — Other Ambulatory Visit: Payer: Self-pay | Admitting: Family

## 2024-03-10 ENCOUNTER — Ambulatory Visit
Admission: RE | Admit: 2024-03-10 | Discharge: 2024-03-10 | Disposition: A | Discharge status: Home / Self Care | Source: Ambulatory Visit | Attending: Family Medicine | Admitting: Family Medicine

## 2024-03-10 DIAGNOSIS — N6320 Unspecified lump in the left breast, unspecified quadrant: Secondary | ICD-10-CM

## 2024-03-18 ENCOUNTER — Other Ambulatory Visit: Payer: Self-pay | Admitting: Family

## 2024-03-18 DIAGNOSIS — M109 Gout, unspecified: Secondary | ICD-10-CM

## 2024-04-28 ENCOUNTER — Ambulatory Visit

## 2024-07-21 ENCOUNTER — Encounter: Admitting: Family Medicine
